# Patient Record
Sex: Male | Born: 1960 | Race: White | Hispanic: No | Marital: Married | Smoking: Never smoker
Health system: Southern US, Community
[De-identification: ages and names within clinical notes are randomized; demographics above are authoritative.]

## PROBLEM LIST (undated history)

## (undated) DIAGNOSIS — E119 Type 2 diabetes mellitus without complications: Secondary | ICD-10-CM

## (undated) DIAGNOSIS — I2510 Atherosclerotic heart disease of native coronary artery without angina pectoris: Secondary | ICD-10-CM

## (undated) DIAGNOSIS — I10 Essential (primary) hypertension: Secondary | ICD-10-CM

---

## 2019-05-29 HISTORY — PX: LEFT HEART CATH: CATH118248

## 2020-02-04 ENCOUNTER — Encounter: Payer: Self-pay | Admitting: Emergency Medicine

## 2020-02-04 ENCOUNTER — Inpatient Hospital Stay
Admission: EM | Admit: 2020-02-04 | Discharge: 2020-02-07 | DRG: 372 | Disposition: A | Discharge status: Home / Self Care | Payer: BC Managed Care – PPO | Attending: Internal Medicine | Admitting: Internal Medicine

## 2020-02-04 ENCOUNTER — Other Ambulatory Visit: Payer: Self-pay

## 2020-02-04 ENCOUNTER — Emergency Department: Payer: BC Managed Care – PPO

## 2020-02-04 ENCOUNTER — Ambulatory Visit
Admission: EM | Admit: 2020-02-04 | Discharge: 2020-02-04 | Disposition: A | Discharge status: Home / Self Care | Payer: BC Managed Care – PPO

## 2020-02-04 DIAGNOSIS — F419 Anxiety disorder, unspecified: Secondary | ICD-10-CM | POA: Diagnosis present

## 2020-02-04 DIAGNOSIS — E785 Hyperlipidemia, unspecified: Secondary | ICD-10-CM | POA: Diagnosis present

## 2020-02-04 DIAGNOSIS — R112 Nausea with vomiting, unspecified: Secondary | ICD-10-CM

## 2020-02-04 DIAGNOSIS — Z7982 Long term (current) use of aspirin: Secondary | ICD-10-CM

## 2020-02-04 DIAGNOSIS — Z955 Presence of coronary angioplasty implant and graft: Secondary | ICD-10-CM

## 2020-02-04 DIAGNOSIS — I2510 Atherosclerotic heart disease of native coronary artery without angina pectoris: Secondary | ICD-10-CM | POA: Diagnosis present

## 2020-02-04 DIAGNOSIS — Z8249 Family history of ischemic heart disease and other diseases of the circulatory system: Secondary | ICD-10-CM

## 2020-02-04 DIAGNOSIS — Z20822 Contact with and (suspected) exposure to covid-19: Secondary | ICD-10-CM | POA: Diagnosis present

## 2020-02-04 DIAGNOSIS — Z9049 Acquired absence of other specified parts of digestive tract: Secondary | ICD-10-CM

## 2020-02-04 DIAGNOSIS — K529 Noninfective gastroenteritis and colitis, unspecified: Secondary | ICD-10-CM | POA: Diagnosis present

## 2020-02-04 DIAGNOSIS — I252 Old myocardial infarction: Secondary | ICD-10-CM

## 2020-02-04 DIAGNOSIS — K047 Periapical abscess without sinus: Secondary | ICD-10-CM | POA: Diagnosis present

## 2020-02-04 DIAGNOSIS — E119 Type 2 diabetes mellitus without complications: Secondary | ICD-10-CM

## 2020-02-04 DIAGNOSIS — A020 Salmonella enteritis: Secondary | ICD-10-CM | POA: Diagnosis not present

## 2020-02-04 DIAGNOSIS — F17210 Nicotine dependence, cigarettes, uncomplicated: Secondary | ICD-10-CM | POA: Diagnosis present

## 2020-02-04 DIAGNOSIS — Z888 Allergy status to other drugs, medicaments and biological substances status: Secondary | ICD-10-CM

## 2020-02-04 DIAGNOSIS — E1159 Type 2 diabetes mellitus with other circulatory complications: Secondary | ICD-10-CM | POA: Diagnosis present

## 2020-02-04 DIAGNOSIS — E1169 Type 2 diabetes mellitus with other specified complication: Secondary | ICD-10-CM | POA: Diagnosis present

## 2020-02-04 DIAGNOSIS — Z833 Family history of diabetes mellitus: Secondary | ICD-10-CM

## 2020-02-04 DIAGNOSIS — I152 Hypertension secondary to endocrine disorders: Secondary | ICD-10-CM | POA: Diagnosis present

## 2020-02-04 DIAGNOSIS — K921 Melena: Secondary | ICD-10-CM | POA: Diagnosis present

## 2020-02-04 DIAGNOSIS — D500 Iron deficiency anemia secondary to blood loss (chronic): Secondary | ICD-10-CM | POA: Diagnosis present

## 2020-02-04 DIAGNOSIS — Z79899 Other long term (current) drug therapy: Secondary | ICD-10-CM

## 2020-02-04 DIAGNOSIS — Z880 Allergy status to penicillin: Secondary | ICD-10-CM

## 2020-02-04 HISTORY — DX: Atherosclerotic heart disease of native coronary artery without angina pectoris: I25.10

## 2020-02-04 HISTORY — DX: Type 2 diabetes mellitus without complications: E11.9

## 2020-02-04 HISTORY — DX: Essential (primary) hypertension: I10

## 2020-02-04 LAB — GLUCOSE, CAPILLARY
Glucose-Capillary: 150 mg/dL — ABNORMAL HIGH (ref 70–99)
Glucose-Capillary: 163 mg/dL — ABNORMAL HIGH (ref 70–99)

## 2020-02-04 LAB — COMPREHENSIVE METABOLIC PANEL
ALT: 19 U/L (ref 0–44)
AST: 17 U/L (ref 15–41)
Albumin: 4.2 g/dL (ref 3.5–5.0)
Alkaline Phosphatase: 79 U/L (ref 38–126)
Anion gap: 13 (ref 5–15)
BUN: 26 mg/dL — ABNORMAL HIGH (ref 6–20)
CO2: 17 mmol/L — ABNORMAL LOW (ref 22–32)
Calcium: 9 mg/dL (ref 8.9–10.3)
Chloride: 102 mmol/L (ref 98–111)
Creatinine, Ser: 1.15 mg/dL (ref 0.61–1.24)
GFR calc Af Amer: >60 mL/min (ref >60)
GFR calc non Af Amer: >60 mL/min (ref >60)
Glucose, Bld: 158 mg/dL — ABNORMAL HIGH (ref 70–99)
Potassium: 3.5 mmol/L (ref 3.5–5.1)
Sodium: 132 mmol/L — ABNORMAL LOW (ref 135–145)
Total Bilirubin: 0.9 mg/dL (ref 0.3–1.2)
Total Protein: 8.2 g/dL — ABNORMAL HIGH (ref 6.5–8.1)

## 2020-02-04 LAB — GASTROINTESTINAL PANEL BY PCR, STOOL (REPLACES STOOL CULTURE)

## 2020-02-04 LAB — OCCULT BLOOD X 1 CARD TO LAB, STOOL: Fecal Occult Bld: POSITIVE — AB

## 2020-02-04 LAB — CBC
HCT: 45.4 % (ref 39.0–52.0)
Hemoglobin: 15.7 g/dL (ref 13.0–17.0)
MCH: 29.8 pg (ref 26.0–34.0)
MCHC: 34.6 g/dL (ref 30.0–36.0)
MCV: 86.3 fL (ref 80.0–100.0)
Platelets: 257 10*3/uL (ref 150–400)
RBC: 5.26 MIL/uL (ref 4.22–5.81)
RDW: 13.2 % (ref 11.5–15.5)
WBC: 15.8 10*3/uL — ABNORMAL HIGH (ref 4.0–10.5)
nRBC: 0 % (ref 0.0–0.2)

## 2020-02-04 LAB — SARS CORONAVIRUS 2 BY RT PCR (HOSPITAL ORDER, PERFORMED IN ~~LOC~~ HOSPITAL LAB): SARS Coronavirus 2: NEGATIVE

## 2020-02-04 LAB — LACTIC ACID, PLASMA
Lactic Acid, Venous: 2.4 mmol/L (ref 0.5–1.9)
Lactic Acid, Venous: 1.2 mmol/L (ref 0.5–1.9)

## 2020-02-04 LAB — LIPASE, BLOOD: Lipase: 23 U/L (ref 11–51)

## 2020-02-04 LAB — C DIFFICILE QUICK SCREEN W PCR REFLEX
C Diff antigen: NEGATIVE
C Diff interpretation: NOT DETECTED
C Diff toxin: NEGATIVE

## 2020-02-04 MED ORDER — ONDANSETRON HCL 4 MG/2ML IJ SOLN
4.0000 mg | Freq: Four times a day (QID) | INTRAMUSCULAR | Status: DC | PRN
  Administered 2020-02-05: 4 mg via INTRAVENOUS
  Filled 2020-02-04: qty 2

## 2020-02-04 MED ORDER — ACETAMINOPHEN 325 MG PO TABS: 650.0000 mg | ORAL_TABLET | Freq: Four times a day (QID) | ORAL | Status: DC | PRN

## 2020-02-04 MED ORDER — MORPHINE SULFATE (PF) 2 MG/ML IV SOLN
1.0000 mg | INTRAVENOUS | Status: AC | PRN
  Administered 2020-02-04 – 2020-02-05 (×3): 1 mg via INTRAVENOUS
  Filled 2020-02-04 (×3): qty 1

## 2020-02-04 MED ORDER — ONDANSETRON HCL 4 MG/2ML IJ SOLN
4.0000 mg | Freq: Once | INTRAMUSCULAR | Status: AC
  Administered 2020-02-04: 4 mg via INTRAVENOUS
  Filled 2020-02-04: qty 2

## 2020-02-04 MED ORDER — CIPROFLOXACIN IN D5W 400 MG/200ML IV SOLN
400.0000 mg | Freq: Two times a day (BID) | INTRAVENOUS | Status: DC
  Administered 2020-02-05 – 2020-02-07 (×5): 400 mg via INTRAVENOUS
  Filled 2020-02-04 (×6): qty 200

## 2020-02-04 MED ORDER — MORPHINE SULFATE (PF) 4 MG/ML IV SOLN
4.0000 mg | Freq: Once | INTRAVENOUS | Status: AC
  Administered 2020-02-04: 4 mg via INTRAVENOUS
  Filled 2020-02-04: qty 1

## 2020-02-04 MED ORDER — ACETAMINOPHEN 650 MG RE SUPP: 650.0000 mg | Freq: Four times a day (QID) | RECTAL | Status: DC | PRN

## 2020-02-04 MED ORDER — METRONIDAZOLE IN NACL 5-0.79 MG/ML-% IV SOLN
500.0000 mg | Freq: Three times a day (TID) | INTRAVENOUS | Status: DC
  Administered 2020-02-04 – 2020-02-05 (×2): 500 mg via INTRAVENOUS
  Filled 2020-02-04 (×4): qty 100

## 2020-02-04 MED ORDER — POTASSIUM CHLORIDE 2 MEQ/ML IV SOLN
INTRAVENOUS | Status: AC
  Filled 2020-02-04 (×3): qty 1000

## 2020-02-04 MED ORDER — INSULIN ASPART 100 UNIT/ML ~~LOC~~ SOLN
0.0000 [IU] | SUBCUTANEOUS | Status: DC
  Administered 2020-02-04: 1 [IU] via SUBCUTANEOUS
  Administered 2020-02-04 – 2020-02-05 (×3): 2 [IU] via SUBCUTANEOUS
  Administered 2020-02-05 – 2020-02-06 (×4): 1 [IU] via SUBCUTANEOUS
  Administered 2020-02-06: 2 [IU] via SUBCUTANEOUS
  Administered 2020-02-07 (×2): 1 [IU] via SUBCUTANEOUS
  Administered 2020-02-07: 2 [IU] via SUBCUTANEOUS
  Filled 2020-02-04 (×12): qty 1

## 2020-02-04 MED ORDER — CIPROFLOXACIN IN D5W 400 MG/200ML IV SOLN
400.0000 mg | Freq: Once | INTRAVENOUS | Status: DC
  Administered 2020-02-04: 400 mg via INTRAVENOUS
  Filled 2020-02-04: qty 200

## 2020-02-04 MED ORDER — METRONIDAZOLE IN NACL 5-0.79 MG/ML-% IV SOLN: 500.0000 mg | Freq: Once | INTRAVENOUS | Status: DC

## 2020-02-04 MED ORDER — ONDANSETRON HCL 4 MG PO TABS: 4.0000 mg | ORAL_TABLET | Freq: Four times a day (QID) | ORAL | Status: DC | PRN

## 2020-02-04 MED ORDER — SODIUM CHLORIDE 0.9 % IV BOLUS
1000.0000 mL | Freq: Once | INTRAVENOUS | Status: AC
  Administered 2020-02-04: 1000 mL via INTRAVENOUS

## 2020-02-04 MED ORDER — IOHEXOL 300 MG/ML  SOLN
100.0000 mL | Freq: Once | INTRAMUSCULAR | Status: AC | PRN
  Administered 2020-02-04: 100 mL via INTRAVENOUS

## 2020-02-04 NOTE — H&P (Addendum)
History and Physical    Alexander Benson ASN:[id number] DOB: 07/24/1960 DOA: 02/04/2020  PCP: System, Provider Not In  Patient coming from: Urgent care  I have personally briefly reviewed patient's old medical records in [facility]  Chief Complaint: Frequent diarrhea  HPI: Alexander Benson is a 59 y.o. male with medical history significant for CAD s/p NSTEMI/PCI with DES of LAD 05/29/2019, T2DM, HTN, HLD, anxiety, and tobacco use who presents to the ED for evaluation of abdominal pain and diarrhea.  Patient states about 1 week ago he had a very large meal consisting of chicken and dumplings and became extremely bloated with abdominal pain afterwards. He says since then he has not been able to eat much. He says he also had a tooth infection a week ago and was started on an antibiotic and 800 mg ibuprofen by his dentist. He says he took 2 or 3 doses of these medications each.  2 days ago he developed sudden onset of frequent watery diarrhea. He says stools initially brown-colored, then clear, and now dark black in appearance. He has not been able to maintain any adequate oral intake. He has had nausea and dry heaves without emesis. He denies subjective fevers but reports chills and significant diaphoresis. He has had pain across his lower abdomen. He has not seen any bright red blood from his rectum or any other obvious bleeding.  He denies any chest pain, dyspnea, or dysuria. He is a current smoker and is attempting to quit. He says he did not tolerate Wellbutrin which was recently started for smoking cessation assistance.  He has been on aspirin and Brilinta but has not taken since his symptoms began.  ED Course:  Initial vitals showed BP 127/92, pulse 96, RR 18, temp 98.1 Fahrenheit, SPO2 96% on room air.  Labs show WBC 15.8, hemoglobin 15.7, platelets 257,000, sodium 132, potassium 3.5, bicarb 17, BUN 26, creatinine 1.15, serum glucose 158, LFTs within normal limits, lipase 23, lactic acid  2.4.  GI pathogen panel and C. difficile tests were ordered and pending.  Per EDP FOBT is strongly positive.  SARS-CoV-2 PCR is ordered and pending.  CT abdomen/pelvis with contrast shows liquid stool throughout the colon with wall thickening of the cecum, ascending colon, and hepatic flexure suggestive of infectious or inflammatory colitis.  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  EDP discussed with on-call GI who will consult on patient in the morning. Patient was given 1 L normal saline, IV morphine 4 mg, IV Zofran, in order to receive IV Cipro and Flagyl.  The hospitalist service was consulted to admit for further evaluation and management.  Review of Systems: All systems reviewed and are negative except as documented in history of present illness above.   Past Medical History:  Diagnosis Date  . Coronary artery disease   . Diabetes mellitus without complication (HCC)   . Hypertension     Past Surgical History:  Procedure Laterality Date  . LEFT HEART CATH  05/29/2019   S/p DES to LAD    Social History:  reports that he has never smoked. He has never used smokeless tobacco. He reports that he does not drink alcohol and does not use drugs.  Allergies  Allergen Reactions  . Metformin Rash  . Penicillins Rash and Dermatitis    No known reaction No known reaction     Family History  Problem Relation Age of Onset  . Hypertension  Mother   . Diabetes Brother      Prior to Admission medications   Medication Sig Start Date End Date Taking? Authorizing Provider  aspirin 81 MG EC tablet Take 81 mg by mouth daily.  09/13/19 09/07/20 Yes [provider]  atorvastatin (LIPITOR) 80 MG tablet Take 80 mg by mouth at bedtime.  09/28/19  Yes [provider]  carvedilol (COREG) 6.25 MG tablet Take 6.25 mg by mouth in the morning and at bedtime.  06/24/19  Yes [provider]   ticagrelor (BRILINTA) 90 MG TABS tablet Take 90 mg by mouth in the morning and at bedtime.  06/24/19 06/23/20 Yes [provider]    Physical Exam: Vitals:   02/04/20 1715 02/04/20 1730 02/04/20 1745 02/04/20 1800  BP:  125/82  126/82  Pulse: 83 85 80 87  Resp:      Temp:      TempSrc:      SpO2: 98% 97% 96% 95%  Weight:      Height:       Constitutional: Resting supine in bed, NAD, calm, comfortable Eyes: PERRL, lids and conjunctivae normal ENMT: Mucous membranes are dry. Posterior pharynx clear of any exudate or lesions.poor dentition.  Neck: normal, supple, no masses. Respiratory: clear to auscultation bilaterally, no wheezing, no crackles. Normal respiratory effort. No accessory muscle use.  Cardiovascular: Regular rate and rhythm, no murmurs / rubs / gallops. No extremity edema. 2+ pedal pulses. Abdomen: Mild epigastric tenderness with radiation to lower abdomen, no masses palpated. No hepatosplenomegaly. Bowel sounds positive.  Musculoskeletal: no clubbing / cyanosis. No joint deformity upper and lower extremities. Good ROM, no contractures. Normal muscle tone.  Skin: no rashes, lesions, ulcers. No induration Neurologic: CN 2-12 grossly intact. Sensation intact, Strength 5/5 in all 4.  Psychiatric: Normal judgment and insight. Alert and oriented x 3. Normal mood.   Labs on Admission: I have personally reviewed following labs and imaging studies  CBC: Recent Labs  Lab 02/04/20 1015  WBC 15.8*  HGB 15.7  HCT 45.4  MCV 86.3  PLT 257   Basic Metabolic Panel: Recent Labs  Lab 02/04/20 1015  NA 132*  K 3.5  CL 102  CO2 17*  GLUCOSE 158*  BUN 26*  CREATININE 1.15  CALCIUM 9.0   GFR: Estimated Creatinine Clearance: 74.6 mL/min (by C-G formula based on SCr of 1.15 mg/dL). Liver Function Tests: Recent Labs  Lab 02/04/20 1015  AST 17  ALT 19  ALKPHOS 79  BILITOT 0.9  PROT 8.2*  ALBUMIN 4.2   Recent Labs  Lab 02/04/20 1626  LIPASE 23   No results  for input(s): AMMONIA in the last 168 hours. Coagulation Profile: No results for input(s): INR, PROTIME in the last 168 hours. Cardiac Enzymes: No results for input(s): CKTOTAL, CKMB, CKMBINDEX, TROPONINI in the last 168 hours. BNP (last 3 results) No results for input(s): PROBNP in the last 8760 hours. HbA1C: No results for input(s): HGBA1C in the last 72 hours. CBG: No results for input(s): GLUCAP in the last 168 hours. Lipid Profile: No results for input(s): CHOL, HDL, LDLCALC, TRIG, CHOLHDL, LDLDIRECT in the last 72 hours. Thyroid Function Tests: No results for input(s): TSH, T4TOTAL, FREET4, T3FREE, THYROIDAB in the last 72 hours. Anemia Panel: No results for input(s): VITAMINB12, FOLATE, FERRITIN, TIBC, IRON, RETICCTPCT in the last 72 hours. Urine analysis: No results found for: COLORURINE, APPEARANCEUR, LABSPEC, PHURINE, GLUCOSEU, HGBUR, BILIRUBINUR, KETONESUR, PROTEINUR, UROBILINOGEN, NITRITE, LEUKOCYTESUR  Radiological Exams on Admission: CT ABDOMEN PELVIS  W CONTRAST  Result Date: 02/04/2020 CLINICAL DATA:  Abdominal pain.  Swelling and weakness. EXAM: CT ABDOMEN AND PELVIS WITH CONTRAST TECHNIQUE: Multidetector CT imaging of the abdomen and pelvis was performed using the standard protocol following bolus administration of intravenous contrast. CONTRAST:  OMNIPAQUE IOHEXOL 300 MG/ML  SOLN COMPARISON:  None. FINDINGS: Lower chest: The lung bases are clear. The heart size is normal. Hepatobiliary: The liver is normal. Status post cholecystectomy.There is no biliary ductal dilation. Pancreas: Normal contours without ductal dilatation. No peripancreatic fluid collection. Spleen: Unremarkable. Adrenals/Urinary Tract: --Adrenal glands: Unremarkable. --Right kidney/ureter: No hydronephrosis or radiopaque kidney stones. --Left kidney/ureter: No hydronephrosis or radiopaque kidney stones. --Urinary bladder: Unremarkable. Stomach/Bowel: --Stomach/Duodenum: No hiatal hernia or other  gastric abnormality. Normal duodenal course and caliber. --Small bowel: Unremarkable. --Colon: Liquid stool is noted throughout the colon. There is some mild circumferential wall thickening of the cecum, ascending colon, and hepatic flexure. --Appendix: Normal. Vascular/Lymphatic: Atherosclerotic calcification is present within the non-aneurysmal abdominal aorta, without hemodynamically significant stenosis. 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 --No retroperitoneal lymphadenopathy. --No mesenteric lymphadenopathy. --No pelvic or inguinal lymphadenopathy. Reproductive: Unremarkable Other: No ascites or free air. The abdominal wall is normal. Musculoskeletal. There is no acute displaced fracture. 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 IMPRESSION: 1. Liquid stool throughout the colon with mild circumferential wall thickening of the cecum, ascending colon, and hepatic flexure, suggestive of infectious or inflammatory colitis. 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 Aortic Atherosclerosis (ICD10-I70.0). Electronically Signed   By: Katherine Mantle M.D.   On: 02/04/2020 16:59    EKG: Not performed.  Assessment/Plan Principal Problem:   Colitis Active Problems:   Coronary artery disease   Diabetes mellitus without complication (HCC)   Hypertension associated with diabetes (HCC)   Hyperlipidemia associated with type 2 diabetes mellitus (HCC)  Alexander Benson is a 59 y.o. male with medical history significant for CAD s/p NSTEMI/PCI with DES of LAD 05/29/2019, T2DM, HTN, HLD, anxiety, and tobacco use who is admitted with colitis.  Colitis: Patient with frequent watery diarrhea which is now dark black in appearance. FOBT is positive per EDP. CT A/P shows changes suggestive of colitis. Suspect infectious etiology. Patient does state he was recently started on antibiotics for a dental infection.  He was given a dose of IV ciprofloxacin with plan to also receive IV Flagyl while in the ED. -Hold antibiotics for now pending C. difficile testing -Follow GI pathogen panel -Continue IV fluid hydration overnight -Keep n.p.o. tonight -GI consulted and will see in a.m. Addendum: C. difficile PCR quick screen is negative. Will resume IV Cipro/Flagyl.  CAD s/p PCI: History of NSTEMI with DES placed to LAD 05/29/2019. He denies any recent chest pain. -Holding home aspirin and Brilinta for now with possible GI bleeding, resume as soon as able -Resume home Coreg, atorvastatin when tolerating oral meds  Type 2 diabetes: A1c 7.3% on 01/10/2020. He is not currently on medical management. Start sensitive SSI q4h while NPO and in hospital.  Hypertension: Normotensive. Coreg on hold while NPO.  Hyperlipidemia: Atorvastatin on hold while NPO.  Tobacco use: Patient motivated and attempted to quit. He declines nicotine patch while in hospital.  DVT prophylaxis: SCDs Code Status: Full code, confirmed with patient Family Communication: Discussed with patient, he has discussed with family Disposition Plan: From home and likely discharge home pending symptomatic improvement and further work-up of colitis Consults called: GI  Admission status:  Status is: Observation  The patient remains OBS appropriate and will d/c before 2 midnights.  Dispo: The patient is from: Home              Anticipated d/c is to: Home              Anticipated d/c date is: 1 day              Patient currently is not medically stable to d/c.    Darreld Mclean MD [facility]  If 7PM-7AM, please contact night-coverage [email or web address]  02/04/2020, 7:24 PM

## 2020-02-04 NOTE — ED Triage Notes (Signed)
Pt comes into the ED via EMS from [facility] urgent care with c/o lower abd pain with diarrhea for the past 3 days, states his stools having turned to black color. Denies hx of the same.Marland Kitchen

## 2020-02-04 NOTE — ED Notes (Signed)
Patient is being discharged from the Urgent Care and sent to the Emergency Department via EMS . Per Haydee Monica, [place], patient is in need of higher level of care due to black tarry stools, weakness. Patient is also on a blood thinner. Patient is aware and verbalizes understanding of plan of care.  Vitals:   02/04/20 0843  BP: 121/80  Pulse: 84  Resp: 20  Temp: 98 F (36.7 C)  SpO2: 100%

## 2020-02-04 NOTE — ED Triage Notes (Signed)
Presents vis EMS from [facility] Urgent Care  Per EMS he has had some abd pain  With diarrhea  Noticed some black tarry stools 1-2 days ago

## 2020-02-04 NOTE — ED Triage Notes (Signed)
Patient c/o black stools that started 48 hours ago. He states he is having abdominal pain, sweating and weakness.

## 2020-02-04 NOTE — ED Notes (Signed)
Report off to christine rn.  

## 2020-02-04 NOTE — ED Notes (Signed)
Pt has abd pain with diarrhea and nausea.  Reports dry heaves.  Pt states diarrhea has blood in it.  Sx for 3 days.  Pt was brought in via ems from [place] urgent care.  Iv started and meds given.  Pt alert.

## 2020-02-04 NOTE — ED Provider Notes (Signed)
[facility] Emergency Department Provider Note ____________________________________________   First MD Initiated Contact with Patient 02/04/20 1544     (approximate)  I have reviewed the triage vital signs and the nursing notes.  HISTORY  Chief Complaint GI Bleeding  HPI Jahzir Strohmeier is a 59 y.o. male history of coronary disease, diabetes hypertension  Patient is traveling for work.  Works Personnel officer.  Would last approximately 3 to 4 days he is start developing severe lower abdominal cramps with loose stools.  Initially loose stools, then very watery and now in the last day or so he is noticed the stools to be dark having had about 2 today.  He reports that he is attempted to take Pepto-Bismol yesterday but did not see improvement.  Does take aspirin and Brilinta but no anticoagulants  He is also diabetic.  Previous cholecystectomy.  Denies history of diverticulitis.  Pain comes in severe waves of cramps usually in his lower abdomen.  Does report having a stomach ulcer once in his life several years ago but that seem different.  Patient reports to me feels like he has a infection  Past Medical History:  Diagnosis Date  . Coronary artery disease   . Diabetes mellitus without complication (HCC)   . Hypertension     Patient Active Problem List   Diagnosis Date Noted  . Colitis 02/04/2020  . Coronary artery disease   . Diabetes mellitus without complication (HCC)   . Hypertension associated with diabetes (HCC)   . Hyperlipidemia associated with type 2 diabetes mellitus (HCC)     History reviewed. No pertinent surgical history.  Prior to Admission medications   Medication Sig Start Date End Date Taking? Authorizing Provider  aspirin 81 MG EC tablet Take 81 mg by mouth daily.  09/13/19 09/07/20 Yes [provider]  atorvastatin (LIPITOR) 80 MG tablet Take 80 mg by mouth at bedtime.  09/28/19  Yes [provider]  carvedilol (COREG) 6.25 MG  tablet Take 6.25 mg by mouth in the morning and at bedtime.  06/24/19  Yes [provider]  ticagrelor (BRILINTA) 90 MG TABS tablet Take 90 mg by mouth in the morning and at bedtime.  06/24/19 06/23/20 Yes [provider]    Allergies Metformin and Penicillins  No family history on file.  Social History Social History   Tobacco Use  . Smoking status:  Current smoker  . Smokeless tobacco: Never Used  Substance Use Topics  . Alcohol use: Never  . Drug use: Never  Patient is an active smoker  Review of Systems Constitutional: No fever/chills feeling a bit fatigued the last couple days Eyes: No visual changes. ENT: No sore throat. Cardiovascular: Denies chest pain. Respiratory: Denies shortness of breath. Gastrointestinal: See HPI Genitourinary: Negative for dysuria. Musculoskeletal: Negative for back pain. Skin: Negative for rash. Neurological: Negative for headaches, areas of focal weakness or numbness.    ____________________________________________   PHYSICAL EXAM:  VITAL SIGNS: ED Triage Vitals  Enc Vitals Group     BP 02/04/20 1002 (!) 127/92     Pulse Rate 02/04/20 1002 96     Resp 02/04/20 1002 18     Temp 02/04/20 1002 98.1 F (36.7 C)     Temp Source 02/04/20 1002 Oral     SpO2 02/04/20 1002 96 %     Weight 02/04/20 1003 199 lb (90.3 kg)     Height 02/04/20 1003 5\' 11"  (1.803 m)     Head Circumference --  Peak Flow --      Pain Score 02/04/20 1003 6     Pain Loc --      Pain Edu? --      Excl. in GC? --    Constitutional: Alert and oriented. Well appearing and in no acute distress.  He is however frequently dry heaving reporting that he is currently having an episode of severe abdominal cramping but reports that once it eases off he will feel better for a little bit. Eyes: Conjunctivae are normal. Head: Atraumatic. Nose: No congestion/rhinnorhea. Mouth/Throat: Mucous membranes are moist. Neck: No stridor.  Cardiovascular: Normal  rate, regular rhythm. Grossly normal heart sounds.  Good peripheral circulation. Respiratory: Normal respiratory effort.  No retractions. Lungs CTAB. Gastrointestinal: Soft and moderate tenderness throughout, but does report worsening of tenderness across the lower abdomen bilaterally.  There is some voluntary guarding.  No distention.  Bowel sounds increased.  Rectal exam discussed with patient, patient reports he just had a stool and was black Musculoskeletal: No lower extremity tenderness nor edema. Neurologic:  Normal speech and language. No gross focal neurologic deficits are appreciated.  Skin:  Skin is warm, dry and intact. No rash noted. Psychiatric: Mood and affect are normal. Speech and behavior are normal.  ____________________________________________   LABS (all labs ordered are listed, but only abnormal results are displayed)  Labs Reviewed  COMPREHENSIVE METABOLIC PANEL - Abnormal; Notable for the following components:      Result Value   Sodium 132 (*)    CO2 17 (*)    Glucose, Bld 158 (*)    BUN 26 (*)    Total Protein 8.2 (*)    All other components within normal limits  CBC - Abnormal; Notable for the following components:   WBC 15.8 (*)    All other components within normal limits  LACTIC ACID, PLASMA - Abnormal; Notable for the following components:   Lactic Acid, Venous 2.4 (*)    All other components within normal limits  GASTROINTESTINAL PANEL BY PCR, STOOL (REPLACES STOOL CULTURE)  C DIFFICILE QUICK SCREEN W PCR REFLEX  SARS CORONAVIRUS 2 BY RT PCR (HOSPITAL ORDER, PERFORMED IN [facility])  LIPASE, BLOOD  OCCULT BLOOD X 1 CARD TO LAB, STOOL  TYPE AND SCREEN   ____________________________________________  EKG  No associated chest pain.  Crampy lower abdominal pain. ____________________________________________  RADIOLOGY  CT ABDOMEN PELVIS W CONTRAST  Result Date: 02/04/2020 CLINICAL DATA:  Abdominal pain.  Swelling and weakness.  EXAM: CT ABDOMEN AND PELVIS WITH CONTRAST TECHNIQUE: Multidetector CT imaging of the abdomen and pelvis was performed using the standard protocol following bolus administration of intravenous contrast. CONTRAST:  OMNIPAQUE IOHEXOL 300 MG/ML  SOLN COMPARISON:  None. FINDINGS: Lower chest: The lung bases are clear. The heart size is normal. Hepatobiliary: The liver is normal. Status post cholecystectomy.There is no biliary ductal dilation. Pancreas: Normal contours without ductal dilatation. No peripancreatic fluid collection. Spleen: Unremarkable. Adrenals/Urinary Tract: --Adrenal glands: Unremarkable. --Right kidney/ureter: No hydronephrosis or radiopaque kidney stones. --Left kidney/ureter: No hydronephrosis or radiopaque kidney stones. --Urinary bladder: Unremarkable. Stomach/Bowel: --Stomach/Duodenum: No hiatal hernia or other gastric abnormality. Normal duodenal course and caliber. --Small bowel: Unremarkable. --Colon: Liquid stool is noted throughout the colon. There is some mild circumferential wall thickening of the cecum, ascending colon, and hepatic flexure. --Appendix: Normal. Vascular/Lymphatic: Atherosclerotic calcification is present within the non-aneurysmal abdominal aorta, without hemodynamically significant stenosis. 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 --No retroperitoneal lymphadenopathy. --  No mesenteric lymphadenopathy. --No pelvic or inguinal lymphadenopathy. Reproductive: Unremarkable Other: No ascites or free air. The abdominal wall is normal. Musculoskeletal. There is no acute displaced fracture. 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 IMPRESSION: 1. Liquid stool throughout the colon with mild circumferential wall thickening of the cecum, ascending colon, and hepatic flexure, suggestive of infectious or inflammatory colitis. 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  Aortic Atherosclerosis (ICD10-I70.0). Electronically Signed   By: Katherine Mantle M.D.   On: 02/04/2020 16:59     Imaging reviewed, discussed with Dr. Daleen Squibb of gastroenterology.  Reviewed, concerning for colitis ____________________________________________   PROCEDURES  Procedure(s) performed: None  Procedures  Critical Care performed: No  ____________________________________________   INITIAL IMPRESSION / ASSESSMENT AND PLAN / ED COURSE  Pertinent labs & imaging results that were available during my care of the patient were reviewed by me and considered in my medical decision making (see chart for details).   Differential diagnosis includes but is not limited to, abdominal perforation, aortic dissection, cholecystitis, appendicitis, diverticulitis, colitis, esophagitis/gastritis, kidney stone, pyelonephritis, urinary tract infection, aortic aneurysm. All are considered in decision and treatment plan. Based upon the patient's presentation and risk factors, will provide pain relief, hydration, antiemetic, and obtain CT imaging   Clinical Course as of Feb 03 1830  Tue Feb 04, 2020  1616 Patient cramping improving, he is able to tolerate rectal exam.  His Hemoccult very heme positive, dark stool.   [MQ]  1756 Paged GI to discuss results/clinical exam and histroy.   [MQ]  1801 Discussed with Dr. Servando Snare. Dr. Servando Snare recommends Gi consult, overnight obs with pain control and GI consult plan. Dr. Tobi Bastos will be able to see patient.    [MQ]    Clinical Course User Index [MQ] Sharyn Creamer, MD   ----------------------------------------- 6:30 PM on 02/04/2020 -----------------------------------------  Discussed admission with Dr. Allena Katz.  He recommends we hold antibiotic pending stool culture, they will follow up on the hospitalist service.  Anticipate admission and GI consult.  Patient agreeable.  Reports his pain is significantly improved and he is feeling quite a bit better after initial  fluids and pain medications.  He is awake alert oriented, normal vital signs appears appropriate for admission  ____________________________________________   FINAL CLINICAL IMPRESSION(S) / ED DIAGNOSES  Final diagnoses:  Colitis        Note:  This document was prepared using Dragon voice recognition software and may include unintentional dictation errors       Sharyn Creamer, MD 02/04/20 1831

## 2020-02-05 ENCOUNTER — Inpatient Hospital Stay: Payer: BC Managed Care – PPO

## 2020-02-05 DIAGNOSIS — Z8249 Family history of ischemic heart disease and other diseases of the circulatory system: Secondary | ICD-10-CM | POA: Diagnosis not present

## 2020-02-05 DIAGNOSIS — F17210 Nicotine dependence, cigarettes, uncomplicated: Secondary | ICD-10-CM | POA: Diagnosis present

## 2020-02-05 DIAGNOSIS — A020 Salmonella enteritis: Secondary | ICD-10-CM | POA: Diagnosis present

## 2020-02-05 DIAGNOSIS — Z79899 Other long term (current) drug therapy: Secondary | ICD-10-CM | POA: Diagnosis not present

## 2020-02-05 DIAGNOSIS — K529 Noninfective gastroenteritis and colitis, unspecified: Secondary | ICD-10-CM | POA: Diagnosis present

## 2020-02-05 DIAGNOSIS — Z955 Presence of coronary angioplasty implant and graft: Secondary | ICD-10-CM | POA: Diagnosis not present

## 2020-02-05 DIAGNOSIS — Z20822 Contact with and (suspected) exposure to covid-19: Secondary | ICD-10-CM | POA: Diagnosis present

## 2020-02-05 DIAGNOSIS — I2510 Atherosclerotic heart disease of native coronary artery without angina pectoris: Secondary | ICD-10-CM | POA: Diagnosis present

## 2020-02-05 DIAGNOSIS — K921 Melena: Secondary | ICD-10-CM | POA: Diagnosis present

## 2020-02-05 DIAGNOSIS — E785 Hyperlipidemia, unspecified: Secondary | ICD-10-CM | POA: Diagnosis present

## 2020-02-05 DIAGNOSIS — Z833 Family history of diabetes mellitus: Secondary | ICD-10-CM | POA: Diagnosis not present

## 2020-02-05 DIAGNOSIS — D500 Iron deficiency anemia secondary to blood loss (chronic): Secondary | ICD-10-CM | POA: Diagnosis present

## 2020-02-05 DIAGNOSIS — E1169 Type 2 diabetes mellitus with other specified complication: Secondary | ICD-10-CM | POA: Diagnosis present

## 2020-02-05 DIAGNOSIS — Z7982 Long term (current) use of aspirin: Secondary | ICD-10-CM | POA: Diagnosis not present

## 2020-02-05 DIAGNOSIS — I252 Old myocardial infarction: Secondary | ICD-10-CM | POA: Diagnosis not present

## 2020-02-05 DIAGNOSIS — F419 Anxiety disorder, unspecified: Secondary | ICD-10-CM | POA: Diagnosis present

## 2020-02-05 DIAGNOSIS — K047 Periapical abscess without sinus: Secondary | ICD-10-CM | POA: Diagnosis present

## 2020-02-05 DIAGNOSIS — Z9049 Acquired absence of other specified parts of digestive tract: Secondary | ICD-10-CM | POA: Diagnosis not present

## 2020-02-05 DIAGNOSIS — Z888 Allergy status to other drugs, medicaments and biological substances status: Secondary | ICD-10-CM | POA: Diagnosis not present

## 2020-02-05 DIAGNOSIS — Z880 Allergy status to penicillin: Secondary | ICD-10-CM | POA: Diagnosis not present

## 2020-02-05 DIAGNOSIS — I152 Hypertension secondary to endocrine disorders: Secondary | ICD-10-CM | POA: Diagnosis present

## 2020-02-05 LAB — BASIC METABOLIC PANEL
Anion gap: 7 (ref 5–15)
BUN: 21 mg/dL — ABNORMAL HIGH (ref 6–20)
CO2: 24 mmol/L (ref 22–32)
Calcium: 8.1 mg/dL — ABNORMAL LOW (ref 8.9–10.3)
Chloride: 105 mmol/L (ref 98–111)
Creatinine, Ser: 1.04 mg/dL (ref 0.61–1.24)
GFR calc Af Amer: >60 mL/min (ref >60)
GFR calc non Af Amer: >60 mL/min (ref >60)
Glucose, Bld: 136 mg/dL — ABNORMAL HIGH (ref 70–99)
Potassium: 3.6 mmol/L (ref 3.5–5.1)
Sodium: 136 mmol/L (ref 135–145)

## 2020-02-05 LAB — HEMOGLOBIN AND HEMATOCRIT, BLOOD
HCT: 28.4 % — ABNORMAL LOW (ref 39.0–52.0)
Hemoglobin: 9.6 g/dL — ABNORMAL LOW (ref 13.0–17.0)

## 2020-02-05 LAB — GLUCOSE, CAPILLARY
Glucose-Capillary: 118 mg/dL — ABNORMAL HIGH (ref 70–99)
Glucose-Capillary: 118 mg/dL — ABNORMAL HIGH (ref 70–99)
Glucose-Capillary: 123 mg/dL — ABNORMAL HIGH (ref 70–99)
Glucose-Capillary: 146 mg/dL — ABNORMAL HIGH (ref 70–99)
Glucose-Capillary: 161 mg/dL — ABNORMAL HIGH (ref 70–99)
Glucose-Capillary: 166 mg/dL — ABNORMAL HIGH (ref 70–99)

## 2020-02-05 LAB — PREPARE RBC (CROSSMATCH)

## 2020-02-05 LAB — CBC
HCT: 37.2 % — ABNORMAL LOW (ref 39.0–52.0)
Hemoglobin: 12.7 g/dL — ABNORMAL LOW (ref 13.0–17.0)
MCH: 29.9 pg (ref 26.0–34.0)
MCHC: 34.1 g/dL (ref 30.0–36.0)
MCV: 87.5 fL (ref 80.0–100.0)
Platelets: 230 10*3/uL (ref 150–400)
RBC: 4.25 MIL/uL (ref 4.22–5.81)
RDW: 13.2 % (ref 11.5–15.5)
WBC: 9.8 10*3/uL (ref 4.0–10.5)
nRBC: 0 % (ref 0.0–0.2)

## 2020-02-05 LAB — HIV ANTIBODY (ROUTINE TESTING W REFLEX): HIV Screen 4th Generation wRfx: NONREACTIVE

## 2020-02-05 LAB — MAGNESIUM: Magnesium: 2 mg/dL (ref 1.7–2.4)

## 2020-02-05 LAB — ABO/RH: ABO/RH(D): B NEG

## 2020-02-05 MED ORDER — POTASSIUM CHLORIDE 2 MEQ/ML IV SOLN
INTRAVENOUS | Status: AC
  Filled 2020-02-05 (×2): qty 1000

## 2020-02-05 MED ORDER — PROMETHAZINE HCL 25 MG/ML IJ SOLN
12.5000 mg | INTRAMUSCULAR | Status: DC | PRN
  Administered 2020-02-05: 12.5 mg via INTRAVENOUS
  Filled 2020-02-05 (×2): qty 1

## 2020-02-05 MED ORDER — PANTOPRAZOLE SODIUM 40 MG IV SOLR
40.0000 mg | INTRAVENOUS | Status: DC
  Administered 2020-02-05: 40 mg via INTRAVENOUS
  Filled 2020-02-05: qty 40

## 2020-02-05 MED ORDER — SODIUM CHLORIDE 0.9% IV SOLUTION: Freq: Once | INTRAVENOUS | Status: AC

## 2020-02-05 MED ORDER — TICAGRELOR 90 MG PO TABS
90.0000 mg | ORAL_TABLET | Freq: Two times a day (BID) | ORAL | Status: DC
  Administered 2020-02-05: 90 mg via ORAL
  Filled 2020-02-05 (×3): qty 1

## 2020-02-05 MED ORDER — LORAZEPAM 2 MG/ML IJ SOLN: 1.0000 mg | Freq: Four times a day (QID) | INTRAMUSCULAR | Status: DC | PRN

## 2020-02-05 MED ORDER — ASPIRIN EC 81 MG PO TBEC
81.0000 mg | DELAYED_RELEASE_TABLET | Freq: Every day | ORAL | Status: DC
  Administered 2020-02-05: 81 mg via ORAL
  Filled 2020-02-05: qty 1

## 2020-02-05 MED ORDER — CARVEDILOL 6.25 MG PO TABS
6.2500 mg | ORAL_TABLET | Freq: Two times a day (BID) | ORAL | Status: DC
  Administered 2020-02-06 – 2020-02-07 (×3): 6.25 mg via ORAL
  Filled 2020-02-05 (×4): qty 1

## 2020-02-05 NOTE — Progress Notes (Addendum)
Cross Cover Note RN reports 8 black stools today.  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 Review of labs show 3 gm drop in hgb in 24 hours.  Repeating H & H now and every 6 hours. Starting daily PPI (increase to BID or infusion if indicated) HGB now 9.6 down from 15.7 yesterday morning. With evidence of ongoing bleeding, unit of PRBC's ordered

## 2020-02-05 NOTE — Progress Notes (Addendum)
Pt. had 6 black liquid stool today per patient. He was also vomiting x 3 and had abdominal pain after some dinner intake. PRN meds given

## 2020-02-05 NOTE — Progress Notes (Signed)
PROGRESS NOTE    Alexander Benson  [name]:[id number] DOB: 04-Jan-1961 DOA: 02/04/2020 PCP: System, Provider Not In  Brief Narrative:  59 year old male history of coronary artery disease status post NSTEMI with DES in January 2021, type 2 diabetes mellitus, hypertension, hyperlipidemia, GERD history of tobacco use who presents for abdominal pain and diarrhea  Patient endorses large meal consisting of chicken and dumplings 1 week prior to presentation and subsequent weakness and worsening diarrhea.  Patient is on dual antiplatelet therapy for coronary artery disease status post DES.  GI PCR panel positive for Salmonella  Pain improving over interval.  Feelings of nausea and vomiting also improving.   Assessment & Plan:   Principal Problem:   Colitis Active Problems:   Coronary artery disease   Diabetes mellitus without complication (HCC)   Hypertension associated with diabetes (HCC)   Hyperlipidemia associated with type 2 diabetes mellitus (HCC)  Salmonella enteritis Intractable nausea vomiting Abdominal pain Likely secondary to poultry consumption 1 week prior to presentation Seems to be responding to IV antibiotic therapy Nausea vomiting abdominal pain improved Plan: Advance to clear liquids Continue ciprofloxacin 40 mg IV every 12 hours As needed antiemetics Can DC Flagyl Monitor clinically.  Can likely advance diet as tolerated Anticipate 24 hours additional inpatient monitoring and treatment and tentative discharge home on 02/06/2020  Guaiac positive stool Likely secondary to infectious colitis No indication for transfusion Suspect that initial three-point drop in hemoglobin was dilutional in nature Plan: Monitor hemoglobin Transfuse as needed hemoglobin less than 8 Currently indication for transfusion  Coronary disease status post NSTEMI with DES in January 2021 Patient was on dual antiplatelet therapy with aspirin Brilinta Held for several days as he has been unable to  tolerate p.o. Patient stent was placed 8 months ago High risk for stent rethrombosis with holding DAPT Plan: Restart aspirin and Brilinta Monitor hemoglobin and bowel movements Restart Coreg 6.25 mg twice daily Hold statin for now  Type 2 diabetes mellitus No medical management on home regimen Last A1c 7.3 on 01/10/2020 Sensitive sliding scale insulin  Hypertension Continue home Coreg  Hyperlipidemia Statin held  Tobacco use Educate patient Nicotine patch offered, declined   DVT prophylaxis: SCD Code Status: Full Family Communication: None today Disposition Plan:Status is: Observation  The patient will require care spanning > 2 midnights and should be moved to inpatient because: IV treatments appropriate due to intensity of illness or inability to take PO  Dispo: The patient is from: Home              Anticipated d/c is to: Home              Anticipated d/c date is: 1 day              Patient currently is not medically stable to d/c.  Still symptomatic from Salmonella enteritis.  Slowly advancing diet today.  Anticipate 1 additional day of inpatient treatment monitoring prior to disposition planning.  Anticipate discharge home on 02/06/2020.   Consultants:   GI  Procedures:   none  Antimicrobials:  Ciprofloxacin  Subjective: Seen and examined.  Continues to endorse some abdominal pain but improving.  Objective: Vitals:   02/04/20 1800 02/04/20 2045 02/04/20 2334 02/05/20 0420  BP: 126/82 133/85 135/85 122/80  Pulse: 87 87 78 78  Resp:  20 20 20   Temp:  (!) 97.5 F (36.4 C) 97.9 F (36.6 C) 98 F (36.7 C)  TempSrc:  Oral Oral Oral  SpO2: 95% 100% 100% 100%  Weight:  82.3 kg    Height:  5\' 11"  (1.803 m)      Intake/Output Summary (Last 24 hours) at 02/05/2020 1123 Last data filed at 02/05/2020 0958 Gross per 24 hour  Intake 1903.36 ml  Output --  Net 1903.36 ml   Filed Weights   02/04/20 1003 02/04/20 2045  Weight: 90.3 kg 82.3 kg     Examination:  General exam: Appears calm and comfortable  Respiratory system: Clear to auscultation. Respiratory effort normal. Cardiovascular system: S1 & S2 heard, RRR. No JVD, murmurs, rubs, gallops or clicks. No pedal edema. Gastrointestinal system: Nontender, nondistended.  Hyperactive bowel sounds  Central nervous system: Alert and oriented. No focal neurological deficits. Extremities: Symmetric 5 x 5 power. Skin: No rashes, lesions or ulcers Psychiatry: Judgement and insight appear normal. Mood & affect appropriate.     Data Reviewed: I have personally reviewed following labs and imaging studies  CBC: Recent Labs  Lab 02/04/20 1015 02/05/20 0647  WBC 15.8* 9.8  HGB 15.7 12.7*  HCT 45.4 37.2*  MCV 86.3 87.5  PLT 257 230   Basic Metabolic Panel: Recent Labs  Lab 02/04/20 1015 02/05/20 0647  NA 132* 136  K 3.5 3.6  CL 102 105  CO2 17* 24  GLUCOSE 158* 136*  BUN 26* 21*  CREATININE 1.15 1.04  CALCIUM 9.0 8.1*  MG  --  2.0   GFR: Estimated Creatinine Clearance: 82.5 mL/min (by C-G formula based on SCr of 1.04 mg/dL). Liver Function Tests: Recent Labs  Lab 02/04/20 1015  AST 17  ALT 19  ALKPHOS 79  BILITOT 0.9  PROT 8.2*  ALBUMIN 4.2   Recent Labs  Lab 02/04/20 1626  LIPASE 23   No results for input(s): AMMONIA in the last 168 hours. Coagulation Profile: No results for input(s): INR, PROTIME in the last 168 hours. Cardiac Enzymes: No results for input(s): CKTOTAL, CKMB, CKMBINDEX, TROPONINI in the last 168 hours. BNP (last 3 results) No results for input(s): PROBNP in the last 8760 hours. HbA1C: No results for input(s): HGBA1C in the last 72 hours. CBG: Recent Labs  Lab 02/04/20 2055 02/04/20 2330 02/05/20 0425 02/05/20 0759  GLUCAP 163* 150* 146* 118*   Lipid Profile: No results for input(s): CHOL, HDL, LDLCALC, TRIG, CHOLHDL, LDLDIRECT in the last 72 hours. Thyroid Function Tests: No results for input(s): TSH, T4TOTAL, FREET4,  T3FREE, THYROIDAB in the last 72 hours. Anemia Panel: No results for input(s): VITAMINB12, FOLATE, FERRITIN, TIBC, IRON, RETICCTPCT in the last 72 hours. Sepsis Labs: Recent Labs  Lab 02/04/20 1626 02/04/20 2134  LATICACIDVEN 2.4* 1.2    Recent Results (from the past 240 hour(s))  Gastrointestinal Panel by PCR , Stool     Status: Abnormal   Collection Time: 02/04/20  3:57 PM   Specimen: Stool  Result Value Ref Range Status   Campylobacter species NOT DETECTED NOT DETECTED Final   Plesimonas shigelloides NOT DETECTED NOT DETECTED Final   Salmonella species DETECTED (A) NOT DETECTED Final    Comment: RESULT CALLED TO, READ BACK BY AND VERIFIED WITH: DEMARCUS STEWARD AT 2306 ON 02/04/20 RWW    Yersinia enterocolitica NOT DETECTED NOT DETECTED Final   Vibrio species NOT DETECTED NOT DETECTED Final   Vibrio cholerae NOT DETECTED NOT DETECTED Final   Enteroaggregative E coli (EAEC) NOT DETECTED NOT DETECTED Final   Enteropathogenic E coli (EPEC) NOT DETECTED NOT DETECTED Final   Enterotoxigenic E coli (ETEC) NOT DETECTED NOT DETECTED Final   Shiga like toxin producing  E coli (STEC) NOT DETECTED NOT DETECTED Final   Shigella/Enteroinvasive E coli (EIEC) NOT DETECTED NOT DETECTED Final   Cryptosporidium NOT DETECTED NOT DETECTED Final   Cyclospora cayetanensis NOT DETECTED NOT DETECTED Final   Entamoeba histolytica NOT DETECTED NOT DETECTED Final   Giardia lamblia NOT DETECTED NOT DETECTED Final   Adenovirus F40/41 NOT DETECTED NOT DETECTED Final   Astrovirus NOT DETECTED NOT DETECTED Final   Norovirus GI/GII NOT DETECTED NOT DETECTED Final   Rotavirus A NOT DETECTED NOT DETECTED Final   Sapovirus (I, II, IV, and V) NOT DETECTED NOT DETECTED Final    Comment: Performed at [facility], [address] [place], [place] [address]  C Difficile Quick Screen w PCR reflex     Status: None   Collection Time: 02/04/20  3:57 PM   Specimen: STOOL  Result Value Ref Range Status   C  Diff antigen NEGATIVE NEGATIVE Final   C Diff toxin NEGATIVE NEGATIVE Final   C Diff interpretation No C. difficile detected.  Final    Comment: Performed at [facility], [address] [place], [place] [address]  SARS Coronavirus 2 by RT PCR (hospital order, performed in [facility] hospital lab) Nasopharyngeal Nasopharyngeal Swab     Status: None   Collection Time: 02/04/20  6:09 PM   Specimen: Nasopharyngeal Swab  Result Value Ref Range Status   SARS Coronavirus 2 NEGATIVE NEGATIVE Final    Comment: (NOTE) SARS-CoV-2 target nucleic acids are NOT DETECTED.  The SARS-CoV-2 RNA is generally detectable in upper and lower respiratory specimens during the acute phase of infection. The lowest concentration of SARS-CoV-2 viral copies this assay can detect is 250 copies / mL. A negative result does not preclude SARS-CoV-2 infection and should not be used as the sole basis for treatment or other patient management decisions.  A negative result may occur with improper specimen collection / handling, submission of specimen other than nasopharyngeal swab, presence of viral mutation(s) within the areas targeted by this assay, and inadequate number of viral copies (<250 copies / mL). A negative result must be combined with clinical observations, patient history, and epidemiological information.  Fact Sheet for Patients:   BoilerBrush.com.cy  Fact Sheet for Healthcare Providers: https://pope.com/  This test is not yet approved or  cleared by the [place] FDA and has been authorized for detection and/or diagnosis of SARS-CoV-2 by FDA under an Emergency Use Authorization (EUA).  This EUA will remain in effect (meaning this test can be used) for the duration of the COVID-19 declaration under Section 564(b)(1) of the Act, 21 U.S.C. section 360bbb-3(b)(1), unless the authorization is terminated or revoked sooner.  Performed at  [facility], [address] [place], [place] [address]          Radiology Studies: CT ABDOMEN PELVIS W CONTRAST  Result Date: 02/04/2020 CLINICAL DATA:  Abdominal pain.  Swelling and weakness. EXAM: CT ABDOMEN AND PELVIS WITH CONTRAST TECHNIQUE: Multidetector CT imaging of the abdomen and pelvis was performed using the standard protocol following bolus administration of intravenous contrast. CONTRAST:  OMNIPAQUE IOHEXOL 300 MG/ML  SOLN COMPARISON:  None. FINDINGS: Lower chest: The lung bases are clear. The heart size is normal. Hepatobiliary: The liver is normal. Status post cholecystectomy.There is no biliary ductal dilation. Pancreas: Normal contours without ductal dilatation. No peripancreatic fluid collection. Spleen: Unremarkable. Adrenals/Urinary Tract: --Adrenal glands: Unremarkable. --Right kidney/ureter: No hydronephrosis or radiopaque kidney stones. --Left kidney/ureter: No hydronephrosis or radiopaque kidney stones. --Urinary bladder:  Unremarkable. Stomach/Bowel: --Stomach/Duodenum: No hiatal hernia or other gastric abnormality. Normal duodenal course and caliber. --Small bowel: Unremarkable. --Colon: Liquid stool is noted throughout the colon. There is some mild circumferential wall thickening of the cecum, ascending colon, and hepatic flexure. --Appendix: Normal. Vascular/Lymphatic: Atherosclerotic calcification is present within the non-aneurysmal abdominal aorta, without hemodynamically significant stenosis. 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 --No retroperitoneal lymphadenopathy. --No mesenteric lymphadenopathy. --No pelvic or inguinal lymphadenopathy. Reproductive: Unremarkable Other: No ascites or free air. The abdominal wall is normal. Musculoskeletal. There is no acute displaced fracture. 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 IMPRESSION: 1. Liquid stool throughout the colon with mild circumferential wall  thickening of the cecum, ascending colon, and hepatic flexure, suggestive of infectious or inflammatory colitis. 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 Aortic Atherosclerosis (ICD10-I70.0). Electronically Signed   By: Katherine Mantle M.D.   On: 02/04/2020 16:59        Scheduled Meds: . insulin aspart  0-9 Units Subcutaneous Q4H   Continuous Infusions: . ciprofloxacin Stopped (02/05/20 0603)  . lactated ringers with kcl 125 mL/hr at 02/05/20 0823     LOS: 0 days    Time spent: 25 minutes    Tresa Moore, MD [facility] Pager [phone]  If 7PM-7AM, please contact night-coverage 02/05/2020, 11:23 AM

## 2020-02-06 ENCOUNTER — Inpatient Hospital Stay: Payer: BC Managed Care – PPO

## 2020-02-06 ENCOUNTER — Encounter: Payer: Self-pay | Admitting: Internal Medicine

## 2020-02-06 LAB — BPAM RBC
Blood Product Expiration Date: 202110222359
ISSUE DATE / TIME: 202109222319
Unit Type and Rh: 1700

## 2020-02-06 LAB — HEMOGLOBIN AND HEMATOCRIT, BLOOD
HCT: 30.1 % — ABNORMAL LOW (ref 39.0–52.0)
HCT: 30.1 % — ABNORMAL LOW (ref 39.0–52.0)
HCT: 28.8 % — ABNORMAL LOW (ref 39.0–52.0)
Hemoglobin: 10.7 g/dL — ABNORMAL LOW (ref 13.0–17.0)
Hemoglobin: 10.2 g/dL — ABNORMAL LOW (ref 13.0–17.0)
Hemoglobin: 10.2 g/dL — ABNORMAL LOW (ref 13.0–17.0)

## 2020-02-06 LAB — TYPE AND SCREEN
ABO/RH(D): B NEG
Antibody Screen: NEGATIVE
Unit division: 0

## 2020-02-06 LAB — GLUCOSE, CAPILLARY
Glucose-Capillary: 131 mg/dL — ABNORMAL HIGH (ref 70–99)
Glucose-Capillary: 121 mg/dL — ABNORMAL HIGH (ref 70–99)
Glucose-Capillary: 117 mg/dL — ABNORMAL HIGH (ref 70–99)
Glucose-Capillary: 149 mg/dL — ABNORMAL HIGH (ref 70–99)
Glucose-Capillary: 72 mg/dL (ref 70–99)

## 2020-02-06 MED ORDER — IOHEXOL 350 MG/ML SOLN
100.0000 mL | Freq: Once | INTRAVENOUS | Status: AC | PRN
  Administered 2020-02-06: 100 mL via INTRAVENOUS

## 2020-02-06 MED ORDER — POTASSIUM CHLORIDE 2 MEQ/ML IV SOLN
INTRAVENOUS | Status: DC
  Filled 2020-02-06 (×4): qty 1000

## 2020-02-06 MED ORDER — PANTOPRAZOLE SODIUM 40 MG IV SOLR
40.0000 mg | Freq: Two times a day (BID) | INTRAVENOUS | Status: DC
  Administered 2020-02-06 – 2020-02-07 (×3): 40 mg via INTRAVENOUS
  Filled 2020-02-06 (×3): qty 40

## 2020-02-06 NOTE — Progress Notes (Signed)
PROGRESS NOTE    Shanon AceMark Defoor  ZOX:[id number] DOB: 1960/11/23 DOA: 02/04/2020 PCP: System, Provider Not In  Brief Narrative:  59 year old male history of coronary artery disease status post NSTEMI with DES in January 2021, type 2 diabetes mellitus, hypertension, hyperlipidemia, GERD history of tobacco use who presents for abdominal pain and diarrhea  Patient endorses large meal consisting of chicken and dumplings 1 week prior to presentation and subsequent weakness and worsening diarrhea.  Patient is on dual antiplatelet therapy for coronary artery disease status post DES.  GI PCR panel positive for Salmonella  Patient has had waxing and waning symptoms.  He had an acute drop in his hemoglobin after several black tarry stools.  He was transfused 1 unit packed red blood cells on 02/05/2020 with appropriate rise in his hemoglobin.  Remains hemodynamically stable.   Assessment & Plan:   Principal Problem:   Colitis Active Problems:   Coronary artery disease   Diabetes mellitus without complication (HCC)   Hypertension associated with diabetes (HCC)   Hyperlipidemia associated with type 2 diabetes mellitus (HCC)   Salmonella enteritis  Salmonella enteritis Intractable nausea vomiting Abdominal pain Likely secondary to poultry consumption 1 week prior to presentation Seems to be responding to IV antibiotic therapy Nausea vomiting abdominal pain initially improved however when diet was cautiously advanced patient was unable to tolerate.  He had several episodes of melenic stools with associated nausea and vomiting. Plan: N.p.o. for now.  Okay for ice chips Continue ciprofloxacin 400 mg IV every 12 hours As needed antiemetics Submental IV fluids Monitor clinically.  Melenic stools Symptomatic blood loss anemia Secondary to infectious process Patient had significant drop in his hemoglobin from high of 15 to a low of 9 Was transfused 1 unit on 02/05/2020 for active bleeding Started on  Protonix twice daily Plan: Continue Protonix Trend hemoglobin every 6 hours Transfuse as needed hemoglobin less than 8 Hold dual antiplatelet therapy  Coronary disease status post NSTEMI with DES in January 2021 Patient was on dual antiplatelet therapy with aspirin Brilinta Held for several days as he has been unable to tolerate p.o. Patient stent was placed 8 months ago High risk for stent rethrombosis with holding DAPT Per patient he has been adherent to his DAPT since January.  He only stopped taking from Monday approximately 3 days prior to presentation Plan: Hold aspirin and Brilinta Monitor hemoglobin and bowel movements Continue Coreg 6.25 mg twice daily Hold statin for now  Type 2 diabetes mellitus No medical management on home regimen Last A1c 7.3 on 01/10/2020 Sensitive sliding scale insulin  Hypertension Continue home Coreg  Hyperlipidemia Statin held  Tobacco use Educate patient Nicotine patch offered, declined   DVT prophylaxis: SCD Code Status: Full Family Communication: wife Steward DroneBrenda via phone [phone] on 02/06/2020 Disposition Plan:Status is: Inpatient  Remains inpatient appropriate because:Inpatient level of care appropriate due to severity of illness   Dispo: The patient is from: Home              Anticipated d/c is to: Home              Anticipated d/c date is: 2 days              Patient currently is not medically stable to d/c.   Still symptomatic from Salmonella enteritis and infectious colitis.  Symptomatic anemia secondary to GI bleed.  Will anticipate additional 48 hours of inpatient treatment and monitoring prior to disposition planning.    Consultants:  none Procedures:  none  Antimicrobials:  Ciprofloxacin  Subjective: Seen and examined.  Endorsing intractable nausea and vomiting and diarrhea  Objective: Vitals:   02/05/20 2316 02/05/20 2344 02/06/20 0147 02/06/20 0803  BP: (!) 127/92 113/80 119/78 137/72  Pulse: 77 79  78 67  Resp: 16 18 16 19   Temp: 98.1 F (36.7 C) 98.2 F (36.8 C) (!) 97.5 F (36.4 C)   TempSrc: Oral Oral Oral   SpO2: 100% 99% 100% 98%  Weight:      Height:        Intake/Output Summary (Last 24 hours) at 02/06/2020 1058 Last data filed at 02/06/2020 1000 Gross per 24 hour  Intake 864.9 ml  Output 300 ml  Net 564.9 ml   Filed Weights   02/04/20 1003 02/04/20 2045  Weight: 90.3 kg 82.3 kg    Examination:  General: No apparent distress, patient appears fatigued HEENT: Normocephalic, atraumatic Neck, supple, trachea midline, no tenderness Heart: Regular rate and rhythm, S1/S2 normal, no murmurs Lungs: Clear to auscultation bilaterally, no adventitious sounds, normal work of breathing Abdomen: Soft, nondistended, hyperactive bowel sounds, tender to palpation epigastrium Extremities: Normal, atraumatic, no clubbing or cyanosis, normal muscle tone Skin: No rashes or lesions, normal color Neurologic: Cranial nerves grossly intact, sensation intact, alert and oriented x3 Psychiatric: Normal affect      Data Reviewed: I have personally reviewed following labs and imaging studies  CBC: Recent Labs  Lab 02/04/20 1015 02/05/20 0647 02/05/20 2124 02/06/20 0352 02/06/20 0912  WBC 15.8* 9.8  --   --   --   HGB 15.7 12.7* 9.6* 10.7* 10.2*  HCT 45.4 37.2* 28.4* 30.1* 30.1*  MCV 86.3 87.5  --   --   --   PLT 257 230  --   --   --    Basic Metabolic Panel: Recent Labs  Lab 02/04/20 1015 02/05/20 0647  NA 132* 136  K 3.5 3.6  CL 102 105  CO2 17* 24  GLUCOSE 158* 136*  BUN 26* 21*  CREATININE 1.15 1.04  CALCIUM 9.0 8.1*  MG  --  2.0   GFR: Estimated Creatinine Clearance: 82.5 mL/min (by C-G formula based on SCr of 1.04 mg/dL). Liver Function Tests: Recent Labs  Lab 02/04/20 1015  AST 17  ALT 19  ALKPHOS 79  BILITOT 0.9  PROT 8.2*  ALBUMIN 4.2   Recent Labs  Lab 02/04/20 1626  LIPASE 23   No results for input(s): AMMONIA in the last 168  hours. Coagulation Profile: No results for input(s): INR, PROTIME in the last 168 hours. Cardiac Enzymes: No results for input(s): CKTOTAL, CKMB, CKMBINDEX, TROPONINI in the last 168 hours. BNP (last 3 results) No results for input(s): PROBNP in the last 8760 hours. HbA1C: No results for input(s): HGBA1C in the last 72 hours. CBG: Recent Labs  Lab 02/05/20 1703 02/05/20 2029 02/05/20 2338 02/06/20 0506 02/06/20 0804  GLUCAP 118* 161* 123* 131* 121*   Lipid Profile: No results for input(s): CHOL, HDL, LDLCALC, TRIG, CHOLHDL, LDLDIRECT in the last 72 hours. Thyroid Function Tests: No results for input(s): TSH, T4TOTAL, FREET4, T3FREE, THYROIDAB in the last 72 hours. Anemia Panel: No results for input(s): VITAMINB12, FOLATE, FERRITIN, TIBC, IRON, RETICCTPCT in the last 72 hours. Sepsis Labs: Recent Labs  Lab 02/04/20 1626 02/04/20 2134  LATICACIDVEN 2.4* 1.2    Recent Results (from the past 240 hour(s))  Gastrointestinal Panel by PCR , Stool     Status: Abnormal   Collection Time: 02/04/20  3:57 PM  Specimen: Stool  Result Value Ref Range Status   Campylobacter species NOT DETECTED NOT DETECTED Final   Plesimonas shigelloides NOT DETECTED NOT DETECTED Final   Salmonella species DETECTED (A) NOT DETECTED Final    Comment: RESULT CALLED TO, READ BACK BY AND VERIFIED WITH: DEMARCUS STEWARD AT 2306 ON 02/04/20 RWW    Yersinia enterocolitica NOT DETECTED NOT DETECTED Final   Vibrio species NOT DETECTED NOT DETECTED Final   Vibrio cholerae NOT DETECTED NOT DETECTED Final   Enteroaggregative E coli (EAEC) NOT DETECTED NOT DETECTED Final   Enteropathogenic E coli (EPEC) NOT DETECTED NOT DETECTED Final   Enterotoxigenic E coli (ETEC) NOT DETECTED NOT DETECTED Final   Shiga like toxin producing E coli (STEC) NOT DETECTED NOT DETECTED Final   Shigella/Enteroinvasive E coli (EIEC) NOT DETECTED NOT DETECTED Final   Cryptosporidium NOT DETECTED NOT DETECTED Final   Cyclospora  cayetanensis NOT DETECTED NOT DETECTED Final   Entamoeba histolytica NOT DETECTED NOT DETECTED Final   Giardia lamblia NOT DETECTED NOT DETECTED Final   Adenovirus F40/41 NOT DETECTED NOT DETECTED Final   Astrovirus NOT DETECTED NOT DETECTED Final   Norovirus GI/GII NOT DETECTED NOT DETECTED Final   Rotavirus A NOT DETECTED NOT DETECTED Final   Sapovirus (I, II, IV, and V) NOT DETECTED NOT DETECTED Final    Comment: Performed at [facility], [address] [place], [place] [address]  C Difficile Quick Screen w PCR reflex     Status: None   Collection Time: 02/04/20  3:57 PM   Specimen: STOOL  Result Value Ref Range Status   C Diff antigen NEGATIVE NEGATIVE Final   C Diff toxin NEGATIVE NEGATIVE Final   C Diff interpretation No C. difficile detected.  Final    Comment: Performed at [facility], [address] [place], [place] [address]  SARS Coronavirus 2 by RT PCR (hospital order, performed in [facility] hospital lab) Nasopharyngeal Nasopharyngeal Swab     Status: None   Collection Time: 02/04/20  6:09 PM   Specimen: Nasopharyngeal Swab  Result Value Ref Range Status   SARS Coronavirus 2 NEGATIVE NEGATIVE Final    Comment: (NOTE) SARS-CoV-2 target nucleic acids are NOT DETECTED.  The SARS-CoV-2 RNA is generally detectable in upper and lower respiratory specimens during the acute phase of infection. The lowest concentration of SARS-CoV-2 viral copies this assay can detect is 250 copies / mL. A negative result does not preclude SARS-CoV-2 infection and should not be used as the sole basis for treatment or other patient management decisions.  A negative result may occur with improper specimen collection / handling, submission of specimen other than nasopharyngeal swab, presence of viral mutation(s) within the areas targeted by this assay, and inadequate number of viral copies (<250 copies / mL). A negative result must be combined with clinical observations,  patient history, and epidemiological information.  Fact Sheet for Patients:   BoilerBrush.com.cy  Fact Sheet for Healthcare Providers: https://pope.com/  This test is not yet approved or  cleared by the [place] FDA and has been authorized for detection and/or diagnosis of SARS-CoV-2 by FDA under an Emergency Use Authorization (EUA).  This EUA will remain in effect (meaning this test can be used) for the duration of the COVID-19 declaration under Section 564(b)(1) of the Act, 21 U.S.C. section 360bbb-3(b)(1), unless the authorization is terminated or revoked sooner.  Performed at [facility], [address] [place], [place] [address]  Radiology Studies: DG Abd 1 View  Result Date: 02/05/2020 CLINICAL DATA:  Intractable nausea and vomiting. EXAM: ABDOMEN - 1 VIEW COMPARISON:  Abdomen pelvis CT, dated February 04, 2020. FINDINGS: There is no evidence of bowel dilatation. Thickening of the wall of the transverse colon is suspected due to an extensive amount of thumb printing within this region. Radiopaque surgical clips are seen overlying the right upper quadrant and lower left pelvis. 4 mm and 5 mm soft tissue calcifications are seen overlying the expected region of the mid left kidney. Upon evaluation of the prior abdomen pelvis CT, these appear to be vascular in origin. IMPRESSION: 1. Findings likely consistent with colitis involving the transverse colon. 2. Subcentimeter calcifications within the mid left abdomen which appear to be vascular in location. 3. Prior cholecystectomy. Electronically Signed   By: Aram Candela M.D.   On: 02/05/2020 19:40   CT ABDOMEN PELVIS W CONTRAST  Result Date: 02/04/2020 CLINICAL DATA:  Abdominal pain.  Swelling and weakness. EXAM: CT ABDOMEN AND PELVIS WITH CONTRAST TECHNIQUE: Multidetector CT imaging of the abdomen and pelvis was performed using the standard protocol  following bolus administration of intravenous contrast. CONTRAST:  OMNIPAQUE IOHEXOL 300 MG/ML  SOLN COMPARISON:  None. FINDINGS: Lower chest: The lung bases are clear. The heart size is normal. Hepatobiliary: The liver is normal. Status post cholecystectomy.There is no biliary ductal dilation. Pancreas: Normal contours without ductal dilatation. No peripancreatic fluid collection. Spleen: Unremarkable. Adrenals/Urinary Tract: --Adrenal glands: Unremarkable. --Right kidney/ureter: No hydronephrosis or radiopaque kidney stones. --Left kidney/ureter: No hydronephrosis or radiopaque kidney stones. --Urinary bladder: Unremarkable. Stomach/Bowel: --Stomach/Duodenum: No hiatal hernia or other gastric abnormality. Normal duodenal course and caliber. --Small bowel: Unremarkable. --Colon: Liquid stool is noted throughout the colon. There is some mild circumferential wall thickening of the cecum, ascending colon, and hepatic flexure. --Appendix: Normal. Vascular/Lymphatic: Atherosclerotic calcification is present within the non-aneurysmal abdominal aorta, without hemodynamically significant stenosis. 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 --No retroperitoneal lymphadenopathy. --No mesenteric lymphadenopathy. --No pelvic or inguinal lymphadenopathy. Reproductive: Unremarkable Other: No ascites or free air. The abdominal wall is normal. Musculoskeletal. There is no acute displaced fracture. 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 IMPRESSION: 1. Liquid stool throughout the colon with mild circumferential wall thickening of the cecum, ascending colon, and hepatic flexure, suggestive of infectious or inflammatory colitis. 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 Aortic Atherosclerosis (ICD10-I70.0). Electronically Signed   By: Katherine Mantle M.D.   On: 02/04/2020 16:59   CT Angio Abd/Pel w/ and/or  w/o  Result Date: 02/06/2020 CLINICAL DATA:  Weakness, diarrhea, GI bleed, dual antiplatelet therapy for coronary disease EXAM: CT ANGIOGRAPHY ABDOMEN AND PELVIS WITH CONTRAST AND WITHOUT CONTRAST TECHNIQUE: Multidetector CT imaging of the abdomen and pelvis was performed using the standard protocol during bolus administration of intravenous contrast. Multiplanar reconstructed images and MIPs were obtained and reviewed to evaluate the vascular anatomy. CONTRAST:  OMNIPAQUE IOHEXOL 350 MG/ML SOLN COMPARISON:  02/04/2020 FINDINGS: VASCULAR Aorta: Atherosclerotic changes of the aorta without acute vascular process, dissection, aneurysm, or occlusive process. No evidence of rupture or leakage. No retroperitoneal hematoma. Celiac: Minor atherosclerotic changes but remains patent including its branches. No active upper GI bleeding in the celiac territory. SMA: Minor atherosclerotic changes but remains patent including its branches. No active GI arterial bleeding in the SMA territory. Renals: Atherosclerotic origins but remain patent. No accessory renal artery. IMA: Remains patent  off the distal aorta including its branches. No active GI arterial bleeding in the IMA territory. Inflow: Moderate iliac atherosclerosis but no iliac occlusion or significant inflow disease. The common, internal and external iliac arteries remain patent. Proximal Outflow: Common femoral arteries are atherosclerotic but remain patent. Profunda femoral arteries are patent bilaterally. Proximal bilateral SFA occlusions noted. Veins: No veno-occlusive process. Review of the MIP images confirms the above findings. NON-VASCULAR Lower chest: Inferior lingula scarring anteriorly. Otherwise clear lung bases. Normal heart size. No pericardial or pleural effusion. Hepatobiliary: Focal fatty infiltration of the liver along the falciform ligament anteriorly. No other focal hepatic abnormality. Hepatic and portal veins are patent. Remote  cholecystectomy. Common bile duct nondilated. Pancreas: Unremarkable. No pancreatic ductal dilatation or surrounding inflammatory changes. Spleen: Normal in size without focal abnormality. Adrenals/Urinary Tract: Adrenal glands are unremarkable. Kidneys are normal, without renal calculi, focal lesion, or hydronephrosis. Bladder is unremarkable. Stomach/Bowel: Negative for bowel obstruction, significant dilatation, ileus, or free air. Some improvement in the wall thickening of the right colon, hepatic flexure and proximal transverse colon. Findings compatible with improving colitis. Scattered colonic air-fluid levels compatible with ongoing diarrhea. No free fluid, fluid collection, hemorrhage, hematoma, abscess, or ascites. Lymphatic: No bulky adenopathy. Reproductive: No significant finding by CT. Other: No abdominal wall hernia or abnormality. No abdominopelvic ascites. Musculoskeletal: Degenerative changes noted spine. No acute osseous finding. 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 IMPRESSION: VASCULAR Negative for acute arterial GI bleeding by CTA. Aortoiliac atherosclerosis without occlusive disease. Bilateral proximal SFA occlusions appearing chronic. NON-VASCULAR Improving colonic wall thickening of the right colon, hepatic flexure and transverse colon compatible with resolving colitis. Aortic Atherosclerosis (ICD10-I70.0). Electronically Signed   By: Judie Petit.  Shick M.D.   On: 02/06/2020 10:46        Scheduled Meds: . carvedilol  6.25 mg Oral BID WC  . insulin aspart  0-9 Units Subcutaneous Q4H  . pantoprazole (PROTONIX) IV  40 mg Intravenous Q12H   Continuous Infusions: . ciprofloxacin 400 mg (02/06/20 0517)  . lactated ringers with kcl       LOS: 1 day    Time spent: 25 minutes    Tresa Moore, MD [facility] Pager [phone]  If 7PM-7AM, please contact night-coverage 02/06/2020, 10:58 AM

## 2020-02-07 LAB — BASIC METABOLIC PANEL
Anion gap: 6 (ref 5–15)
BUN: 9 mg/dL (ref 6–20)
CO2: 24 mmol/L (ref 22–32)
Calcium: 8 mg/dL — ABNORMAL LOW (ref 8.9–10.3)
Chloride: 107 mmol/L (ref 98–111)
Creatinine, Ser: 1.02 mg/dL (ref 0.61–1.24)
GFR calc Af Amer: >60 mL/min (ref >60)
GFR calc non Af Amer: >60 mL/min (ref >60)
Glucose, Bld: 123 mg/dL — ABNORMAL HIGH (ref 70–99)
Potassium: 3.8 mmol/L (ref 3.5–5.1)
Sodium: 137 mmol/L (ref 135–145)

## 2020-02-07 LAB — GLUCOSE, CAPILLARY
Glucose-Capillary: 110 mg/dL — ABNORMAL HIGH (ref 70–99)
Glucose-Capillary: 126 mg/dL — ABNORMAL HIGH (ref 70–99)
Glucose-Capillary: 145 mg/dL — ABNORMAL HIGH (ref 70–99)
Glucose-Capillary: 158 mg/dL — ABNORMAL HIGH (ref 70–99)

## 2020-02-07 MED ORDER — ASPIRIN EC 81 MG PO TBEC
81.0000 mg | DELAYED_RELEASE_TABLET | Freq: Every day | ORAL | Status: DC
  Administered 2020-02-07: 81 mg via ORAL
  Filled 2020-02-07: qty 1

## 2020-02-07 MED ORDER — ONDANSETRON HCL 4 MG PO TABS: 4.0000 mg | ORAL_TABLET | Freq: Four times a day (QID) | ORAL | 0 refills | Status: AC | PRN

## 2020-02-07 MED ORDER — CIPROFLOXACIN HCL 500 MG PO TABS: 500.0000 mg | ORAL_TABLET | Freq: Two times a day (BID) | ORAL | 0 refills | Status: AC

## 2020-02-07 MED ORDER — TICAGRELOR 90 MG PO TABS
90.0000 mg | ORAL_TABLET | Freq: Two times a day (BID) | ORAL | Status: DC
  Administered 2020-02-07: 90 mg via ORAL
  Filled 2020-02-07 (×2): qty 1

## 2020-02-07 NOTE — Discharge Summary (Signed)
Physician Discharge Summary  Alexander Benson UEA:[id number] DOB: 1960-12-16 DOA: 02/04/2020  PCP: System, Provider Not In  Admit date: 02/04/2020 Discharge date: 02/07/2020  Admitted From: Home Disposition:  Home  Recommendations for Outpatient Follow-up:  1. Follow up with PCP in 1-2 weeks 2.   Home Health:No Equipment/Devices:None Discharge Condition:Stable CODE STATUS:Full Diet recommendation: Heart Healthy / Carb Modified  Brief/Interim Summary: 59 year old male history of coronary artery disease status post NSTEMI with DES in January 2021, type 2 diabetes mellitus, hypertension, hyperlipidemia, GERD history of tobacco use who presents for abdominal pain and diarrhea  Patient endorses large meal consisting of chicken and dumplings 1 week prior to presentation and subsequent weakness and worsening diarrhea.  Patient is on dual antiplatelet therapy for coronary artery disease status post DES.  GI PCR panel positive for Salmonella  Patient has had waxing and waning symptoms.  He had an acute drop in his hemoglobin after several black tarry stools.  He was transfused 1 unit packed red blood cells on 02/05/2020 with appropriate rise in his hemoglobin.  Remains hemodynamically stable.  Patient GI bleed spontaneously stopped.  Hemoglobin stable.  Tolerating p.o. intake without issue.  Etiology felt all to be due to Salmonella enteritis.  Patient stable for discharge at this time.  As needed antiemetics and antibiotics prescribed on discharge.  Patient has a long drive to [place].  Will fill his prescriptions at a local pharmacy prior to him going.  Discharge Diagnoses:  Principal Problem:   Colitis Active Problems:   Coronary artery disease   Diabetes mellitus without complication (HCC)   Hypertension associated with diabetes (HCC)   Hyperlipidemia associated with type 2 diabetes mellitus (HCC)   Salmonella enteritis  Salmonella enteritis Intractable nausea vomiting Abdominal  pain Likely secondary to poultry consumption 1 week prior to presentation Nausea vomiting and abdominal pain improved.  Patient also had some black tarry stools with drop in hemoglobin.  He did receive 1 unit packed red blood cells during admission.  Hemoglobin stable posttransfusion and black stools resolved.  Patient able to tolerate tolerate p.o. intake and had a bowel movement prior to discharge.  Stable for discharge at this time.  Continue ciprofloxacin 500 mg every 12 hours for total 7-day course.  As needed antiemetics prescribed as well.  Okay to resume dual antiplatelet therapy.  Melenic stools Symptomatic blood loss anemia Secondary to infectious process Patient had significant drop in his hemoglobin from high of 15 to a low of 9 Was transfused 1 unit on 02/05/2020 for active bleeding No active bleeding noted on discharge.  Twice daily Protonix not indicated.  Complete antibiotic course as above.  As needed antiemetics.  Resume dual antiplatelet therapy.  See cardiologist on upon return back to [place].  Coronary disease status post NSTEMI with DES in January 2021 Patient was on dual antiplatelet therapy with aspirin Brilinta Held for several days as he has been unable to tolerate p.o. Patient stent was placed 8 months ago High risk for stent rethrombosis with holding DAPT Per patient he has been adherent to his DAPT since January.  He only stopped taking from Monday approximately 3 days prior to presentation Hemoglobin stable at time of discharge.  Can resume DAPT.  Continue Coreg 6.25 mg twice daily.  Statin resumed.  Type 2 diabetes mellitus No medical management on home regimen Last A1c 7.3 on 01/10/2020 Sensitive sliding scale insulin while admitted  Hypertension Continue home Coreg  Hyperlipidemia Statin held.  Can continue on discharge  Tobacco use Educate  patient Nicotine patch offered, declined  Discharge Instructions  Discharge Instructions    Diet -  low sodium heart healthy   Complete by: As directed    Increase activity slowly   Complete by: As directed      Allergies as of 02/07/2020      Reactions   Metformin Rash   Penicillins Rash, Dermatitis   No known reaction No known reaction      Medication List    TAKE these medications   aspirin 81 MG EC tablet Take 81 mg by mouth daily.   atorvastatin 80 MG tablet Commonly known as: LIPITOR Take 80 mg by mouth at bedtime.   carvedilol 6.25 MG tablet Commonly known as: COREG Take 6.25 mg by mouth in the morning and at bedtime.   ciprofloxacin 500 MG tablet Commonly known as: Cipro Take 1 tablet (500 mg total) by mouth 2 (two) times daily for 5 days.   ondansetron 4 MG tablet Commonly known as: ZOFRAN Take 1 tablet (4 mg total) by mouth every 6 (six) hours as needed for nausea or vomiting.   ticagrelor 90 MG Tabs tablet Commonly known as: BRILINTA Take 90 mg by mouth in the morning and at bedtime.       Allergies  Allergen Reactions  . Metformin Rash  . Penicillins Rash and Dermatitis    No known reaction No known reaction     Consultations:  None   Procedures/Studies: DG Abd 1 View  Result Date: 02/05/2020 CLINICAL DATA:  Intractable nausea and vomiting. EXAM: ABDOMEN - 1 VIEW COMPARISON:  Abdomen pelvis CT, dated February 04, 2020. FINDINGS: There is no evidence of bowel dilatation. Thickening of the wall of the transverse colon is suspected due to an extensive amount of thumb printing within this region. Radiopaque surgical clips are seen overlying the right upper quadrant and lower left pelvis. 4 mm and 5 mm soft tissue calcifications are seen overlying the expected region of the mid left kidney. Upon evaluation of the prior abdomen pelvis CT, these appear to be vascular in origin. IMPRESSION: 1. Findings likely consistent with colitis involving the transverse colon. 2. Subcentimeter calcifications within the mid left abdomen which appear to be vascular  in location. 3. Prior cholecystectomy. Electronically Signed   By: Aram Candela M.D.   On: 02/05/2020 19:40   CT ABDOMEN PELVIS W CONTRAST  Result Date: 02/04/2020 CLINICAL DATA:  Abdominal pain.  Swelling and weakness. EXAM: CT ABDOMEN AND PELVIS WITH CONTRAST TECHNIQUE: Multidetector CT imaging of the abdomen and pelvis was performed using the standard protocol following bolus administration of intravenous contrast. CONTRAST:  OMNIPAQUE IOHEXOL 300 MG/ML  SOLN COMPARISON:  None. FINDINGS: Lower chest: The lung bases are clear. The heart size is normal. Hepatobiliary: The liver is normal. Status post cholecystectomy.There is no biliary ductal dilation. Pancreas: Normal contours without ductal dilatation. No peripancreatic fluid collection. Spleen: Unremarkable. Adrenals/Urinary Tract: --Adrenal glands: Unremarkable. --Right kidney/ureter: No hydronephrosis or radiopaque kidney stones. --Left kidney/ureter: No hydronephrosis or radiopaque kidney stones. --Urinary bladder: Unremarkable. Stomach/Bowel: --Stomach/Duodenum: No hiatal hernia or other gastric abnormality. Normal duodenal course and caliber. --Small bowel: Unremarkable. --Colon: Liquid stool is noted throughout the colon. There is some mild circumferential wall thickening of the cecum, ascending colon, and hepatic flexure. --Appendix: Normal. Vascular/Lymphatic: Atherosclerotic calcification is present within the non-aneurysmal abdominal aorta, without hemodynamically significant stenosis. 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 --No retroperitoneal lymphadenopathy. --No mesenteric lymphadenopathy. --No pelvic or inguinal lymphadenopathy. Reproductive:  Unremarkable Other: No ascites or free air. The abdominal wall is normal. Musculoskeletal. There is no acute displaced fracture. 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 IMPRESSION: 1. Liquid stool throughout the colon with mild circumferential  wall thickening of the cecum, ascending colon, and hepatic flexure, suggestive of infectious or inflammatory colitis. 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 Aortic Atherosclerosis (ICD10-I70.0). Electronically Signed   By: Katherine Mantle M.D.   On: 02/04/2020 16:59   CT Angio Abd/Pel w/ and/or w/o  Result Date: 02/06/2020 CLINICAL DATA:  Weakness, diarrhea, GI bleed, dual antiplatelet therapy for coronary disease EXAM: CT ANGIOGRAPHY ABDOMEN AND PELVIS WITH CONTRAST AND WITHOUT CONTRAST TECHNIQUE: Multidetector CT imaging of the abdomen and pelvis was performed using the standard protocol during bolus administration of intravenous contrast. Multiplanar reconstructed images and MIPs were obtained and reviewed to evaluate the vascular anatomy. CONTRAST:  OMNIPAQUE IOHEXOL 350 MG/ML SOLN COMPARISON:  02/04/2020 FINDINGS: VASCULAR Aorta: Atherosclerotic changes of the aorta without acute vascular process, dissection, aneurysm, or occlusive process. No evidence of rupture or leakage. No retroperitoneal hematoma. Celiac: Minor atherosclerotic changes but remains patent including its branches. No active upper GI bleeding in the celiac territory. SMA: Minor atherosclerotic changes but remains patent including its branches. No active GI arterial bleeding in the SMA territory. Renals: Atherosclerotic origins but remain patent. No accessory renal artery. IMA: Remains patent off the distal aorta including its branches. No active GI arterial bleeding in the IMA territory. Inflow: Moderate iliac atherosclerosis but no iliac occlusion or significant inflow disease. The common, internal and external iliac arteries remain patent. Proximal Outflow: Common femoral arteries are atherosclerotic but remain patent. Profunda femoral arteries are patent bilaterally. Proximal bilateral SFA occlusions noted. Veins: No veno-occlusive process. Review of the MIP  images confirms the above findings. NON-VASCULAR Lower chest: Inferior lingula scarring anteriorly. Otherwise clear lung bases. Normal heart size. No pericardial or pleural effusion. Hepatobiliary: Focal fatty infiltration of the liver along the falciform ligament anteriorly. No other focal hepatic abnormality. Hepatic and portal veins are patent. Remote cholecystectomy. Common bile duct nondilated. Pancreas: Unremarkable. No pancreatic ductal dilatation or surrounding inflammatory changes. Spleen: Normal in size without focal abnormality. Adrenals/Urinary Tract: Adrenal glands are unremarkable. Kidneys are normal, without renal calculi, focal lesion, or hydronephrosis. Bladder is unremarkable. Stomach/Bowel: Negative for bowel obstruction, significant dilatation, ileus, or free air. Some improvement in the wall thickening of the right colon, hepatic flexure and proximal transverse colon. Findings compatible with improving colitis. Scattered colonic air-fluid levels compatible with ongoing diarrhea. No free fluid, fluid collection, hemorrhage, hematoma, abscess, or ascites. Lymphatic: No bulky adenopathy. Reproductive: No significant finding by CT. Other: No abdominal wall hernia or abnormality. No abdominopelvic ascites. Musculoskeletal: Degenerative changes noted spine. No acute osseous finding. 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 IMPRESSION: VASCULAR Negative for acute arterial GI bleeding by CTA. Aortoiliac atherosclerosis without occlusive disease. Bilateral proximal SFA occlusions appearing chronic. NON-VASCULAR Improving colonic wall thickening of the right colon, hepatic flexure and transverse colon compatible with resolving colitis. Aortic Atherosclerosis (ICD10-I70.0). Electronically Signed   By: Judie Petit.  Shick M.D.   On: 02/06/2020 10:46    (Echo, Carotid, EGD, Colonoscopy, ERCP)    Subjective: Seen and examined the day of discharge.  Stable no distress.  In good spirits.   Tolerating p.o. intake.  Stable for discharge home.  Discharge Exam: Vitals:   02/06/20 2006 02/07/20 0522  BP: 105/81 99/61  Pulse:  71 69  Resp: 18 16  Temp: 98.4 F (36.9 C) (!) 97.4 F (36.3 C)  SpO2: 99% 100%   Vitals:   02/06/20 0803 02/06/20 1237 02/06/20 2006 02/07/20 0522  BP: 137/72 105/69 105/81 99/61  Pulse: 67 68 71 69  Resp: Temp:  98.3 F (36.8 C) 98.4 F (36.9 C) (!) 97.4 F (36.3 C)  TempSrc:  Oral Oral Oral  SpO2: 98% 99% 99% 100%  Weight:      Height:        General: Pt is alert, awake, not in acute distress Cardiovascular: RRR, S1/S2 +, no rubs, no gallops Respiratory: CTA bilaterally, no wheezing, no rhonchi Abdominal: Soft, NT, ND, bowel sounds + Extremities: no edema, no cyanosis    The results of significant diagnostics from this hospitalization (including imaging, microbiology, ancillary and laboratory) are listed below for reference.     Microbiology: Recent Results (from the past 240 hour(s))  Gastrointestinal Panel by PCR , Stool     Status: Abnormal   Collection Time: 02/04/20  3:57 PM   Specimen: Stool  Result Value Ref Range Status   Campylobacter species NOT DETECTED NOT DETECTED Final   Plesimonas shigelloides NOT DETECTED NOT DETECTED Final   Salmonella species DETECTED (A) NOT DETECTED Final    Comment: RESULT CALLED TO, READ BACK BY AND VERIFIED WITH: DEMARCUS STEWARD AT 2306 ON 02/04/20 RWW    Yersinia enterocolitica NOT DETECTED NOT DETECTED Final   Vibrio species NOT DETECTED NOT DETECTED Final   Vibrio cholerae NOT DETECTED NOT DETECTED Final   Enteroaggregative E coli (EAEC) NOT DETECTED NOT DETECTED Final   Enteropathogenic E coli (EPEC) NOT DETECTED NOT DETECTED Final   Enterotoxigenic E coli (ETEC) NOT DETECTED NOT DETECTED Final   Shiga like toxin producing E coli (STEC) NOT DETECTED NOT DETECTED Final   Shigella/Enteroinvasive E coli (EIEC) NOT DETECTED NOT DETECTED Final   Cryptosporidium NOT DETECTED  NOT DETECTED Final   Cyclospora cayetanensis NOT DETECTED NOT DETECTED Final   Entamoeba histolytica NOT DETECTED NOT DETECTED Final   Giardia lamblia NOT DETECTED NOT DETECTED Final   Adenovirus F40/41 NOT DETECTED NOT DETECTED Final   Astrovirus NOT DETECTED NOT DETECTED Final   Norovirus GI/GII NOT DETECTED NOT DETECTED Final   Rotavirus A NOT DETECTED NOT DETECTED Final   Sapovirus (I, II, IV, and V) NOT DETECTED NOT DETECTED Final    Comment: Performed at [facility], [address] [place], [place] [address]  C Difficile Quick Screen w PCR reflex     Status: None   Collection Time: 02/04/20  3:57 PM   Specimen: STOOL  Result Value Ref Range Status   C Diff antigen NEGATIVE NEGATIVE Final   C Diff toxin NEGATIVE NEGATIVE Final   C Diff interpretation No C. difficile detected.  Final    Comment: Performed at [facility], [address] [place], [place] [address]  SARS Coronavirus 2 by RT PCR (hospital order, performed in [facility] hospital lab) Nasopharyngeal Nasopharyngeal Swab     Status: None   Collection Time: 02/04/20  6:09 PM   Specimen: Nasopharyngeal Swab  Result Value Ref Range Status   SARS Coronavirus 2 NEGATIVE NEGATIVE Final    Comment: (NOTE) SARS-CoV-2 target nucleic acids are NOT DETECTED.  The SARS-CoV-2 RNA is generally detectable in upper and lower respiratory specimens during the acute phase of infection. The lowest concentration of SARS-CoV-2 viral copies this assay can detect is 250 copies /  mL. A negative result does not preclude SARS-CoV-2 infection and should not be used as the sole basis for treatment or other patient management decisions.  A negative result may occur with improper specimen collection / handling, submission of specimen other than nasopharyngeal swab, presence of viral mutation(s) within the areas targeted by this assay, and inadequate number of viral copies (<250 copies / mL). A negative result must be  combined with clinical observations, patient history, and epidemiological information.  Fact Sheet for Patients:   BoilerBrush.com.cyhttps://www.fda.gov/media/136312/download  Fact Sheet for Healthcare Providers: https://pope.com/https://www.fda.gov/media/136313/download  This test is not yet approved or  cleared by the [place] FDA and has been authorized for detection and/or diagnosis of SARS-CoV-2 by FDA under an Emergency Use Authorization (EUA).  This EUA will remain in effect (meaning this test can be used) for the duration of the COVID-19 declaration under Section 564(b)(1) of the Act, 21 U.S.C. section 360bbb-3(b)(1), unless the authorization is terminated or revoked sooner.  Performed at [facility], [address] [place], [place] [address]      Labs: BNP (last 3 results) No results for input(s): BNP in the last 8760 hours. Basic Metabolic Panel: Recent Labs  Lab 02/04/20 1015 02/05/20 0647 02/07/20 0406  NA 132* 136 137  K 3.5 3.6 3.8  CL 102 105 107  CO2 17* 24 24  GLUCOSE 158* 136* 123*  BUN 26* 21* 9  CREATININE 1.15 1.04 1.02  CALCIUM 9.0 8.1* 8.0*  MG  --  2.0  --    Liver Function Tests: Recent Labs  Lab 02/04/20 1015  AST 17  ALT 19  ALKPHOS 79  BILITOT 0.9  PROT 8.2*  ALBUMIN 4.2   Recent Labs  Lab 02/04/20 1626  LIPASE 23   No results for input(s): AMMONIA in the last 168 hours. CBC: Recent Labs  Lab 02/04/20 1015 02/04/20 1015 02/05/20 0647 02/05/20 2124 02/06/20 0352 02/06/20 0912 02/06/20 1455  WBC 15.8*  --  9.8  --   --   --   --   HGB 15.7   < > 12.7* 9.6* 10.7* 10.2* 10.2*  HCT 45.4   < > 37.2* 28.4* 30.1* 30.1* 28.8*  MCV 86.3  --  87.5  --   --   --   --   PLT 257  --  230  --   --   --   --    < > = values in this interval not displayed.   Cardiac Enzymes: No results for input(s): CKTOTAL, CKMB, CKMBINDEX, TROPONINI in the last 168 hours. BNP: Invalid input(s): POCBNP CBG: Recent Labs  Lab 02/06/20 2003 02/07/20 0016  02/07/20 0523 02/07/20 0739 02/07/20 1153  GLUCAP 72 110* 158* 126* 145*   D-Dimer No results for input(s): DDIMER in the last 72 hours. Hgb A1c No results for input(s): HGBA1C in the last 72 hours. Lipid Profile No results for input(s): CHOL, HDL, LDLCALC, TRIG, CHOLHDL, LDLDIRECT in the last 72 hours. Thyroid function studies No results for input(s): TSH, T4TOTAL, T3FREE, THYROIDAB in the last 72 hours.  Invalid input(s): FREET3 Anemia work up No results for input(s): VITAMINB12, FOLATE, FERRITIN, TIBC, IRON, RETICCTPCT in the last 72 hours. Urinalysis No results found for: COLORURINE, APPEARANCEUR, LABSPEC, PHURINE, GLUCOSEU, HGBUR, BILIRUBINUR, KETONESUR, PROTEINUR, UROBILINOGEN, NITRITE, LEUKOCYTESUR Sepsis Labs Invalid input(s): PROCALCITONIN,  WBC,  LACTICIDVEN Microbiology Recent Results (from the past 240 hour(s))  Gastrointestinal Panel by PCR , Stool     Status: Abnormal   Collection Time: 02/04/20  3:57 PM   Specimen: Stool  Result Value Ref Range Status   Campylobacter species NOT DETECTED NOT DETECTED Final   Plesimonas shigelloides NOT DETECTED NOT DETECTED Final   Salmonella species DETECTED (A) NOT DETECTED Final    Comment: RESULT CALLED TO, READ BACK BY AND VERIFIED WITH: DEMARCUS STEWARD AT 2306 ON 02/04/20 RWW    Yersinia enterocolitica NOT DETECTED NOT DETECTED Final   Vibrio species NOT DETECTED NOT DETECTED Final   Vibrio cholerae NOT DETECTED NOT DETECTED Final   Enteroaggregative E coli (EAEC) NOT DETECTED NOT DETECTED Final   Enteropathogenic E coli (EPEC) NOT DETECTED NOT DETECTED Final   Enterotoxigenic E coli (ETEC) NOT DETECTED NOT DETECTED Final   Shiga like toxin producing E coli (STEC) NOT DETECTED NOT DETECTED Final   Shigella/Enteroinvasive E coli (EIEC) NOT DETECTED NOT DETECTED Final   Cryptosporidium NOT DETECTED NOT DETECTED Final   Cyclospora cayetanensis NOT DETECTED NOT DETECTED Final   Entamoeba histolytica NOT DETECTED NOT  DETECTED Final   Giardia lamblia NOT DETECTED NOT DETECTED Final   Adenovirus F40/41 NOT DETECTED NOT DETECTED Final   Astrovirus NOT DETECTED NOT DETECTED Final   Norovirus GI/GII NOT DETECTED NOT DETECTED Final   Rotavirus A NOT DETECTED NOT DETECTED Final   Sapovirus (I, II, IV, and V) NOT DETECTED NOT DETECTED Final    Comment: Performed at [facility], [address] [place], [place] [address]  C Difficile Quick Screen w PCR reflex     Status: None   Collection Time: 02/04/20  3:57 PM   Specimen: STOOL  Result Value Ref Range Status   C Diff antigen NEGATIVE NEGATIVE Final   C Diff toxin NEGATIVE NEGATIVE Final   C Diff interpretation No C. difficile detected.  Final    Comment: Performed at [facility], [address] [place], [place] [address]  SARS Coronavirus 2 by RT PCR (hospital order, performed in [facility] hospital lab) Nasopharyngeal Nasopharyngeal Swab     Status: None   Collection Time: 02/04/20  6:09 PM   Specimen: Nasopharyngeal Swab  Result Value Ref Range Status   SARS Coronavirus 2 NEGATIVE NEGATIVE Final    Comment: (NOTE) SARS-CoV-2 target nucleic acids are NOT DETECTED.  The SARS-CoV-2 RNA is generally detectable in upper and lower respiratory specimens during the acute phase of infection. The lowest concentration of SARS-CoV-2 viral copies this assay can detect is 250 copies / mL. A negative result does not preclude SARS-CoV-2 infection and should not be used as the sole basis for treatment or other patient management decisions.  A negative result may occur with improper specimen collection / handling, submission of specimen other than nasopharyngeal swab, presence of viral mutation(s) within the areas targeted by this assay, and inadequate number of viral copies (<250 copies / mL). A negative result must be combined with clinical observations, patient history, and epidemiological information.  Fact Sheet for Patients:    BoilerBrush.com.cy  Fact Sheet for Healthcare Providers: https://pope.com/  This test is not yet approved or  cleared by the [place] FDA and has been authorized for detection and/or diagnosis of SARS-CoV-2 by FDA under an Emergency Use Authorization (EUA).  This EUA will remain in effect (meaning this test can be used) for the duration of the COVID-19 declaration under Section 564(b)(1) of the Act, 21 U.S.C. section 360bbb-3(b)(1), unless the authorization is terminated or revoked sooner.  Performed at [facility], [address] [place], [place] [address]  Time coordinating discharge: Over 30 minutes  SIGNED:   Tresa Moore, MD  [facility] 02/07/2020, 12:53 PM Pager   If 7PM-7AM, please contact night-coverage

## 2020-02-07 NOTE — Progress Notes (Signed)
Alexander Benson  A and O x 4 VSS. Pt tolerating diet well. No complaints of pain or nausea. IV removed intact, prescriptions given. Pt voices understanding of discharge instructions with no further questions. Pt discharged via wheelchair with axillary.   Allergies as of 02/07/2020      Reactions   Metformin Rash   Penicillins Rash, Dermatitis   No known reaction No known reaction      Medication List    TAKE these medications   aspirin 81 MG EC tablet Take 81 mg by mouth daily.   atorvastatin 80 MG tablet Commonly known as: LIPITOR Take 80 mg by mouth at bedtime.   carvedilol 6.25 MG tablet Commonly known as: COREG Take 6.25 mg by mouth in the morning and at bedtime.   ciprofloxacin 500 MG tablet Commonly known as: Cipro Take 1 tablet (500 mg total) by mouth 2 (two) times daily for 5 days.   ondansetron 4 MG tablet Commonly known as: ZOFRAN Take 1 tablet (4 mg total) by mouth every 6 (six) hours as needed for nausea or vomiting.   ticagrelor 90 MG Tabs tablet Commonly known as: BRILINTA Take 90 mg by mouth in the morning and at bedtime.       Vitals:   02/06/20 2006 02/07/20 0522  BP: 105/81 99/61  Pulse: 71 69  Resp: 18 16  Temp: 98.4 F (36.9 C) (!) 97.4 F (36.3 C)  SpO2: 99% 100%    Whitney Post

## 2022-03-14 IMAGING — CT CT ABD-PELV W/ CM
2 of 5 series · 16 of 46 positions shown, 18 images · IV contrast (APPLIED)
Comparison: None.

CLINICAL DATA: Abdominal pain.  Swelling and weakness.

EXAM:
CT ABDOMEN AND PELVIS WITH CONTRAST
TECHNIQUE: Multidetector CT imaging of the abdomen and pelvis was performed
using the standard protocol following bolus administration of
intravenous contrast.
CONTRAST:  100mL OMNIPAQUE IOHEXOL 300 MG/ML  SOLN

[Series 2: axial st · axial · 0.82mm/px · z∈[+276,+696]mm · 13 of 94 slices shown, 15 images]
[im 5/94  soft-tissue]
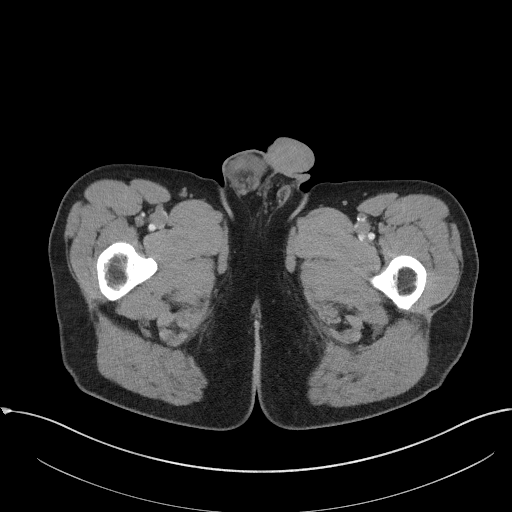
[im 5/94  bone]
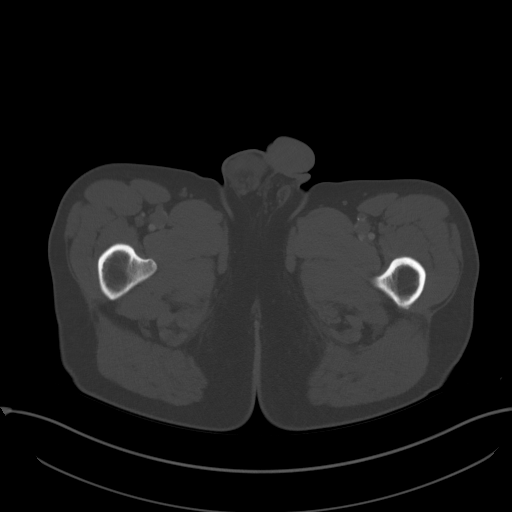
[im 14/94  soft-tissue]
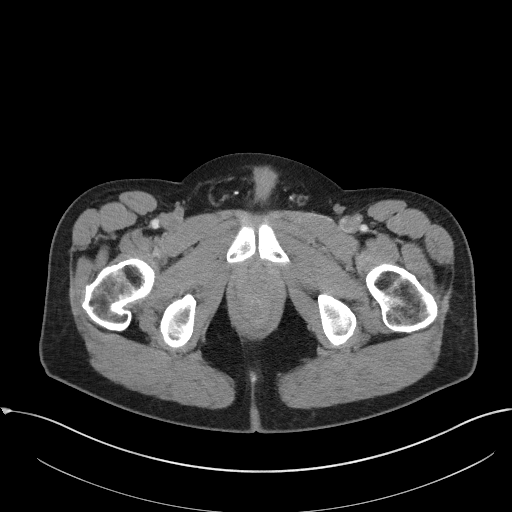
[im 19/94  soft-tissue]
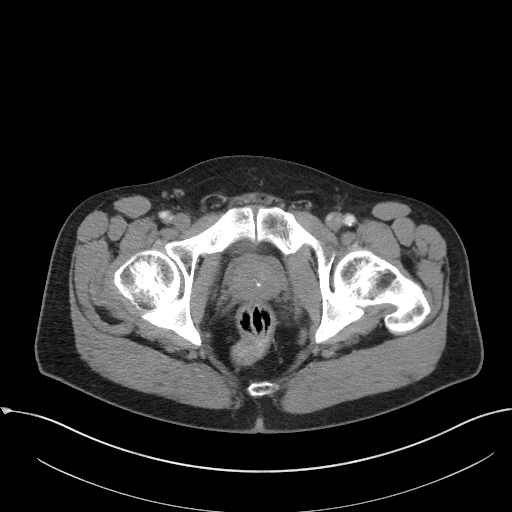
[im 28/94  soft-tissue]
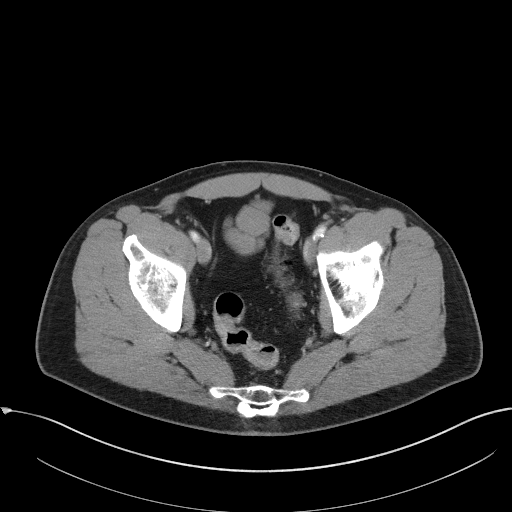
[im 33/94  soft-tissue]
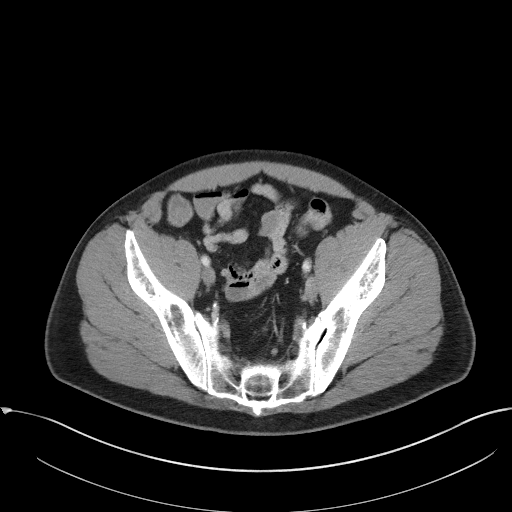
[im 42/94  soft-tissue]
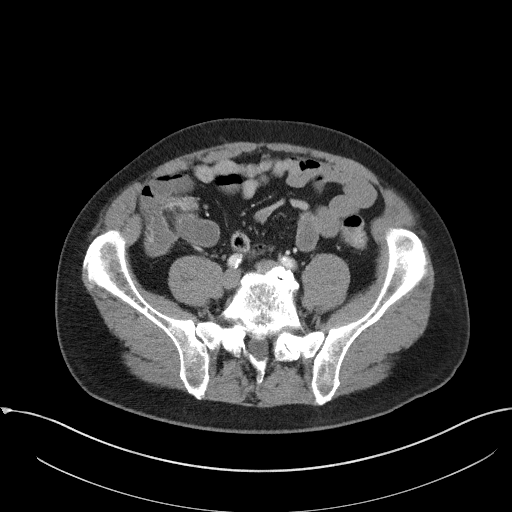
[im 47/94  soft-tissue]
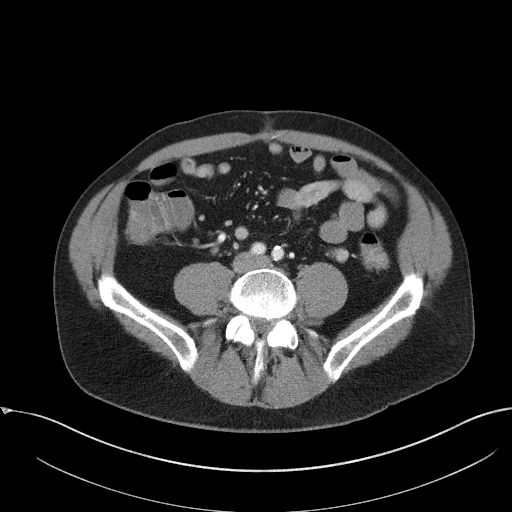
[im 52/94  soft-tissue]
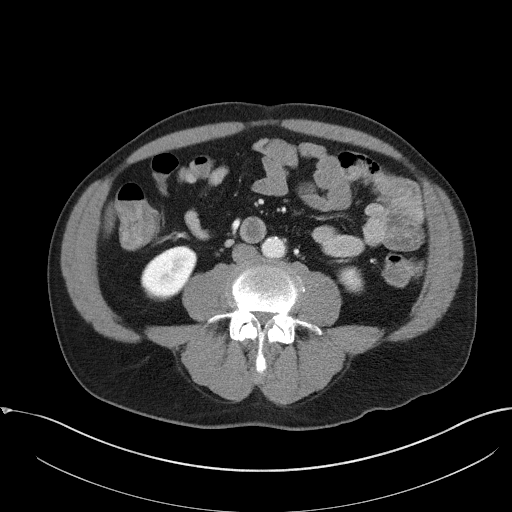
[im 61/94  soft-tissue]
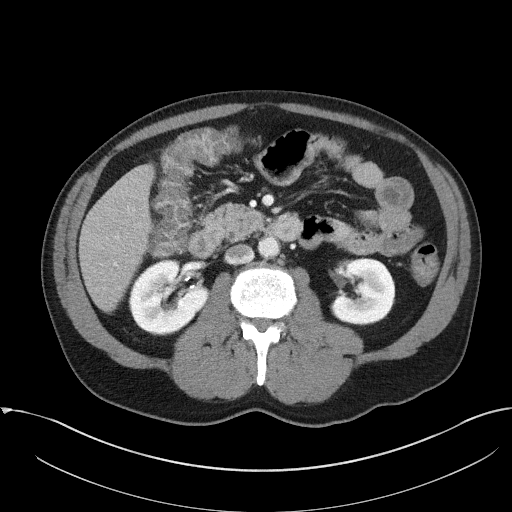
[im 61/94  bone]
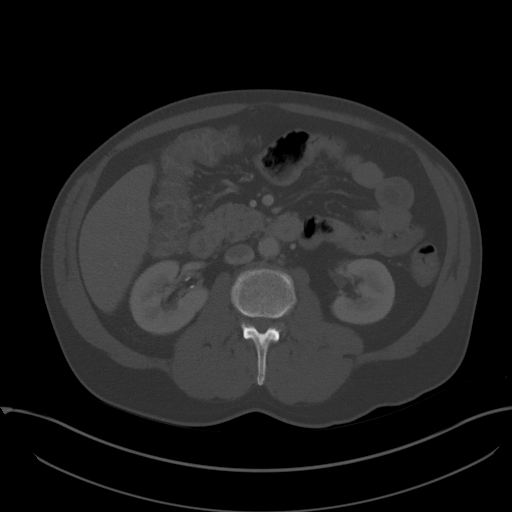
[im 66/94  soft-tissue]
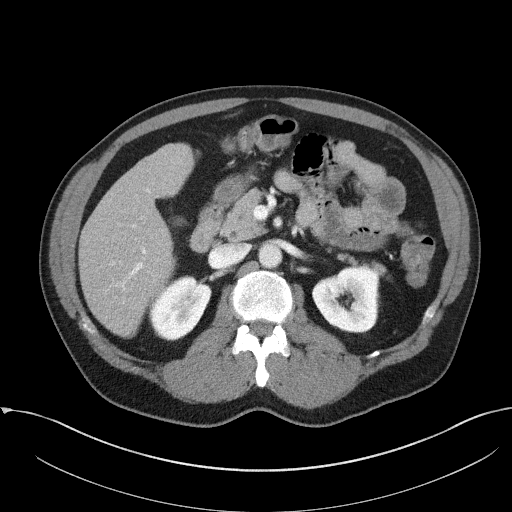
[im 75/94  soft-tissue]
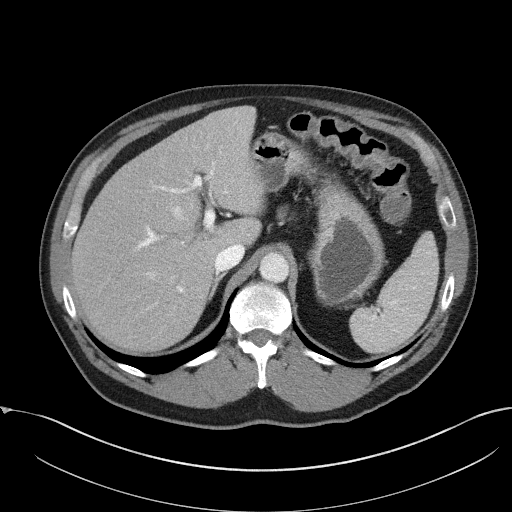
[im 80/94  soft-tissue]
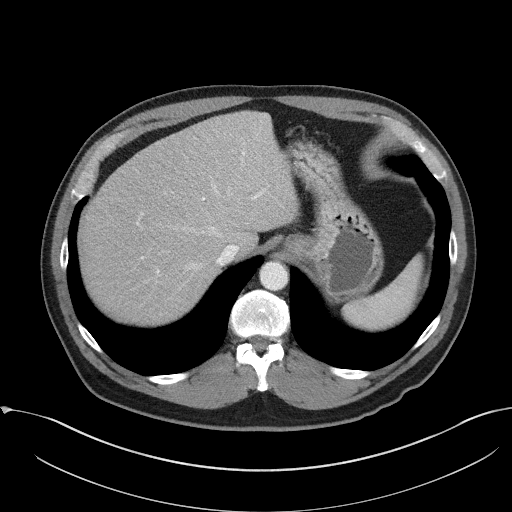
[im 89/94  soft-tissue]
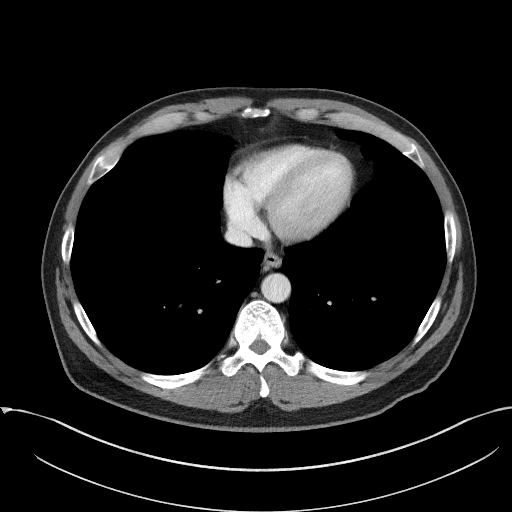

[Series 5: coronal st · coronal · 0.73mm/px · 3 of 90 slices shown]
[im 30/90  soft-tissue]
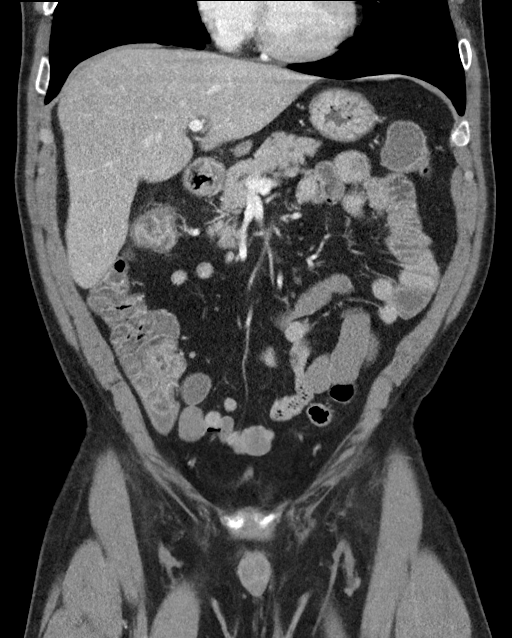
[im 40/90  soft-tissue]
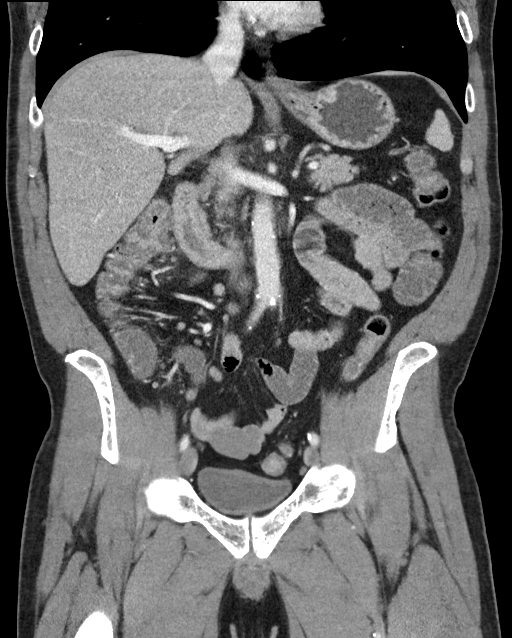
[im 50/90  soft-tissue]
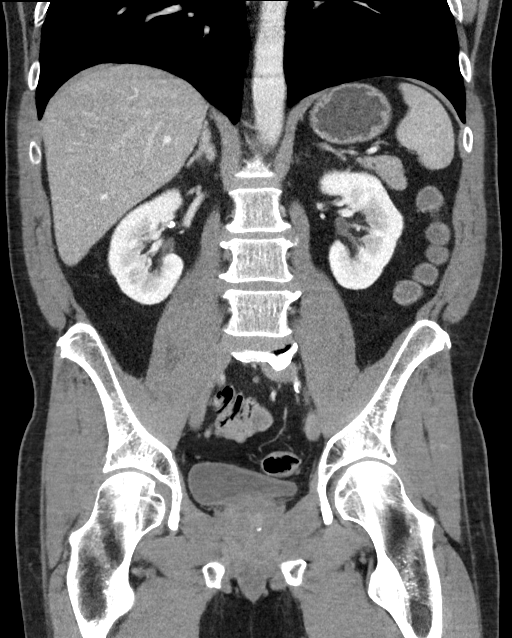

[16 of 46 positions shown; findings below may reference images not displayed]

FINDINGS: Lower chest: The lung bases are clear. The heart size is normal.

Hepatobiliary: The liver is normal. Status post
cholecystectomy.There is no biliary ductal dilation.

Pancreas: Normal contours without ductal dilatation. No
peripancreatic fluid collection.

Spleen: Unremarkable.

Adrenals/Urinary Tract:

--Adrenal glands: Unremarkable.

--Right kidney/ureter: No hydronephrosis or radiopaque kidney
stones.

--Left kidney/ureter: No hydronephrosis or radiopaque kidney stones.

--Urinary bladder: Unremarkable.

Stomach/Bowel:

--Stomach/Duodenum: No hiatal hernia or other gastric abnormality.
Normal duodenal course and caliber.

--Small bowel: Unremarkable.

--Colon: Liquid stool is noted throughout the colon. There is some
mild circumferential wall thickening of the cecum, ascending colon,
and hepatic flexure.

--Appendix: Normal.

Vascular/Lymphatic: Atherosclerotic calcification is present within
the non-aneurysmal abdominal aorta, without hemodynamically
significant stenosis. There appears to be chronic occlusion of the
bilateral proximal SFAs.

--No retroperitoneal lymphadenopathy.

--No mesenteric lymphadenopathy.

--No pelvic or inguinal lymphadenopathy.

Reproductive: Unremarkable

Other: No ascites or free air. The abdominal wall is normal.

Musculoskeletal. There is no acute displaced fracture. There is
avascular necrosis of the bilateral femoral heads.
IMPRESSION: 1. Liquid stool throughout the colon with mild circumferential wall
thickening of the cecum, ascending colon, and hepatic flexure,
suggestive of infectious or inflammatory colitis.
2. Chronic occlusion of the bilateral proximal SFAs.
3. Avascular necrosis of the bilateral femoral heads.

Aortic Atherosclerosis ([id number].D).

## 2022-03-16 IMAGING — CT CT CTA ABD/PEL W/CM AND/OR W/O CM
3 of 10 series · 11 of 46 positions shown, 17 images · IV contrast (omnipaque)
Comparison: 02/04/2020

CLINICAL DATA: Weakness, diarrhea, GI bleed, dual antiplatelet
therapy for coronary disease

EXAM:
CT ANGIOGRAPHY ABDOMEN AND PELVIS WITH CONTRAST AND WITHOUT CONTRAST
TECHNIQUE: Multidetector CT imaging of the abdomen and pelvis was performed
using the standard protocol during bolus administration of
intravenous contrast. Multiplanar reconstructed images and MIPs were
obtained and reviewed to evaluate the vascular anatomy.
CONTRAST:  100mL OMNIPAQUE IOHEXOL 350 MG/ML SOLN

[Series 5: axial arterial · axial · arterial · 0.75mm/px · z∈[-749,-547]mm · 5 of 218 slices shown]
[im 15/218  soft-tissue]
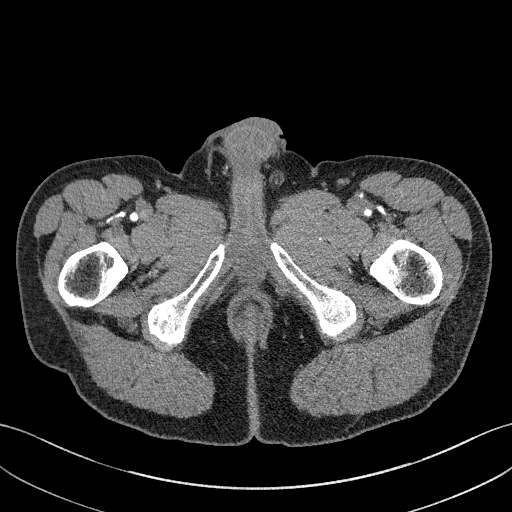
[im 44/218  soft-tissue]
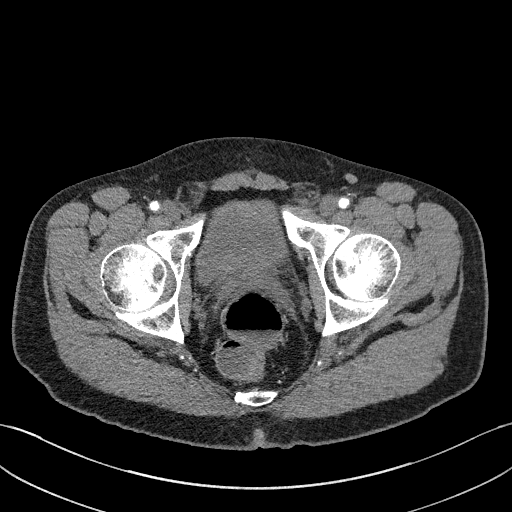
[im 73/218  soft-tissue]
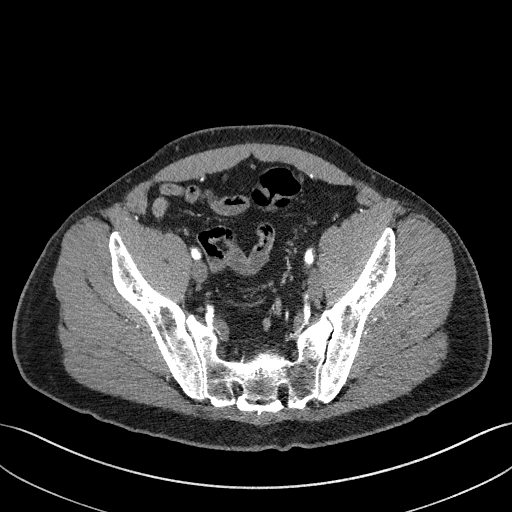
[im 102/218  soft-tissue]
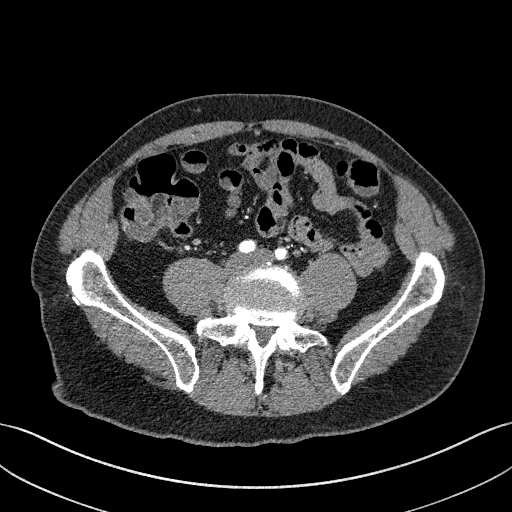
[im 116/218  soft-tissue]
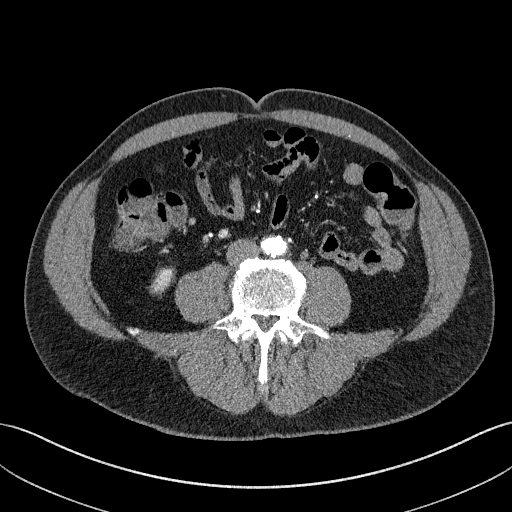

[Series 6: axial venous · axial · portal-venous · 0.75mm/px · z∈[-693,-433]mm · 4 of 88 slices shown, 9 images]
[im 18/88  soft-tissue]
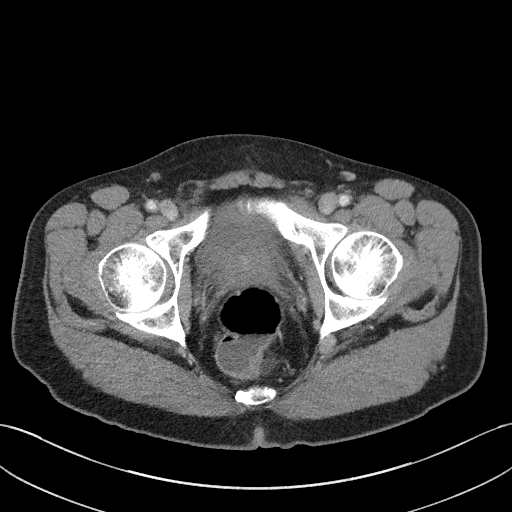
[im 18/88  lung]
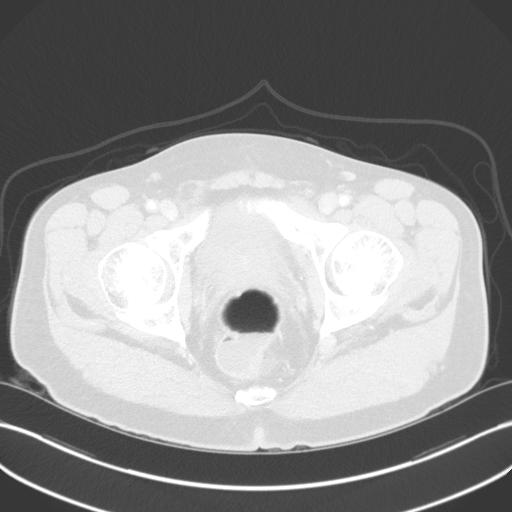
[im 18/88  bone]
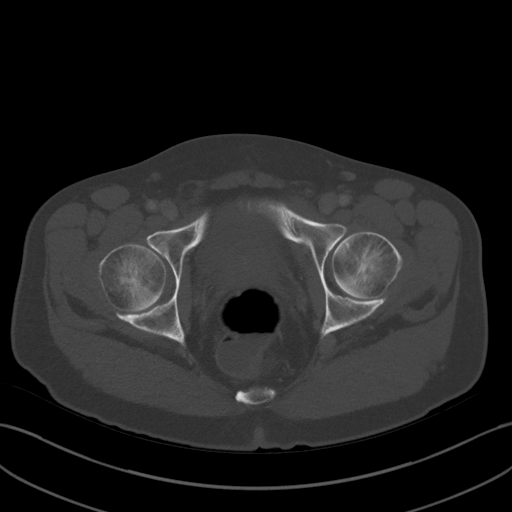
[im 35/88  soft-tissue]
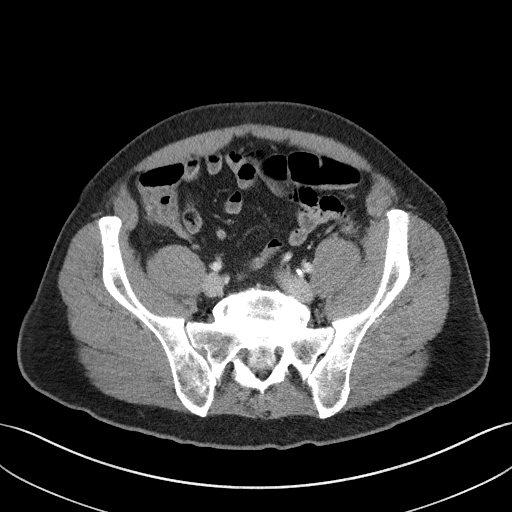
[im 35/88  lung]
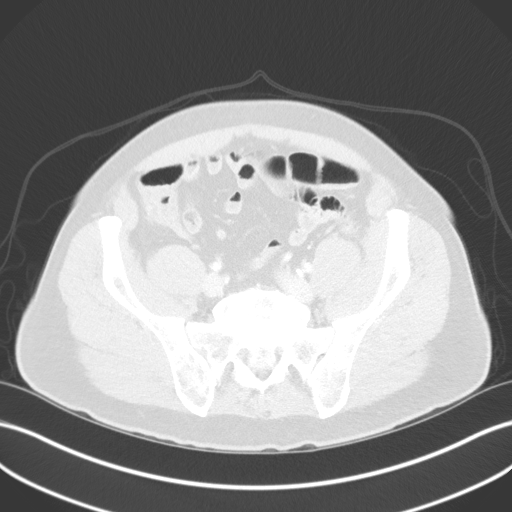
[im 53/88  soft-tissue]
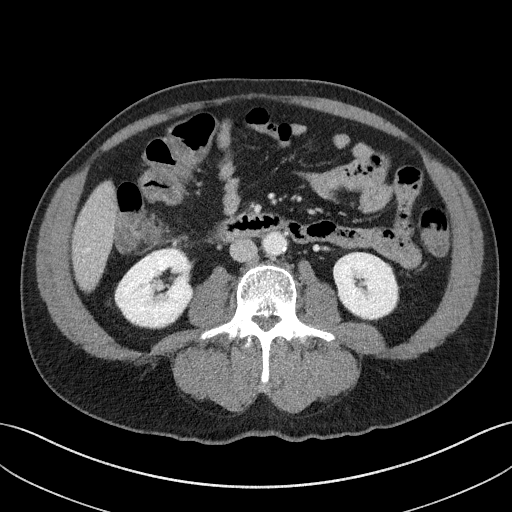
[im 53/88  lung]
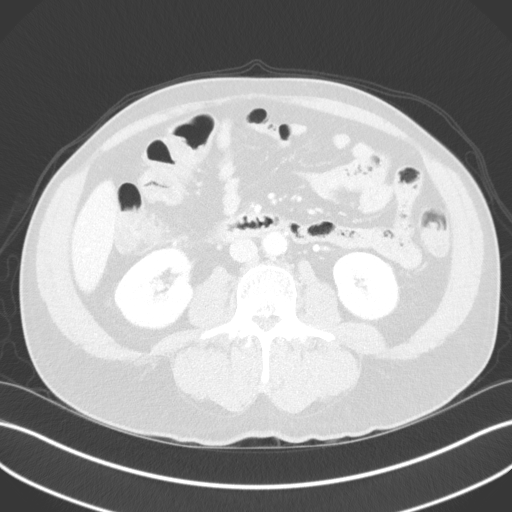
[im 70/88  soft-tissue]
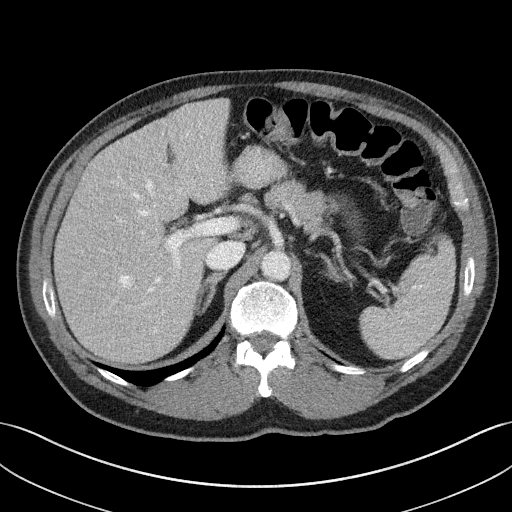
[im 70/88  lung]
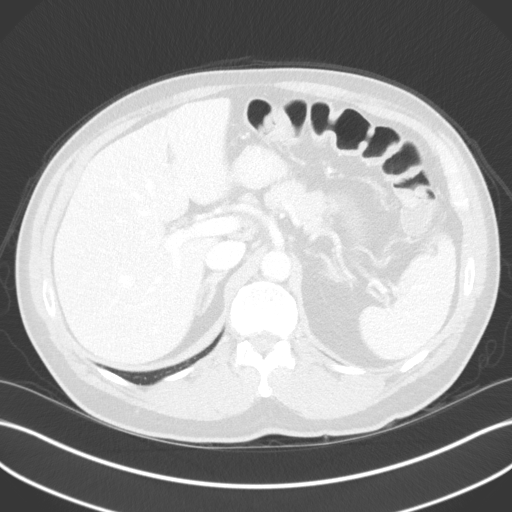

[Series 9: coronal mpr · coronal · 0.73mm/px · 2 of 146 slices shown, 3 images]
[im 49/146  soft-tissue]
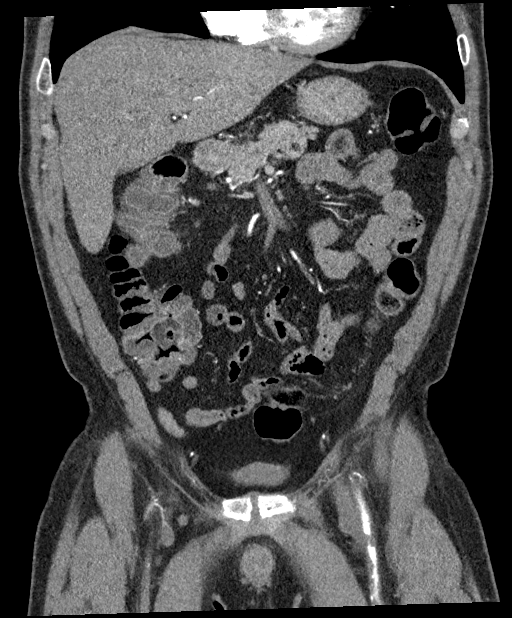
[im 49/146  bone]
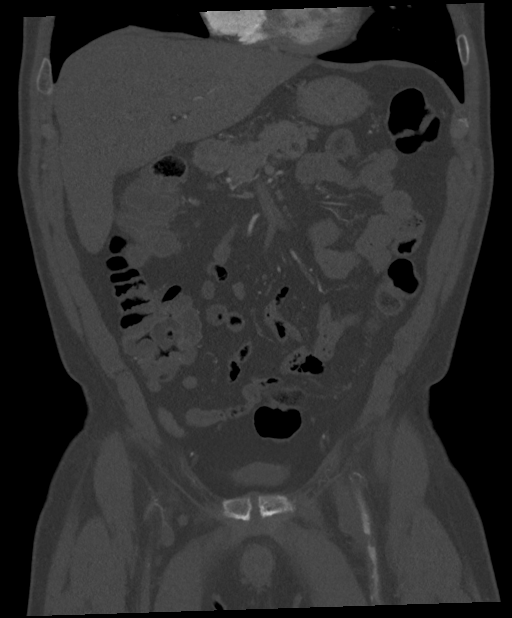
[im 97/146  soft-tissue]
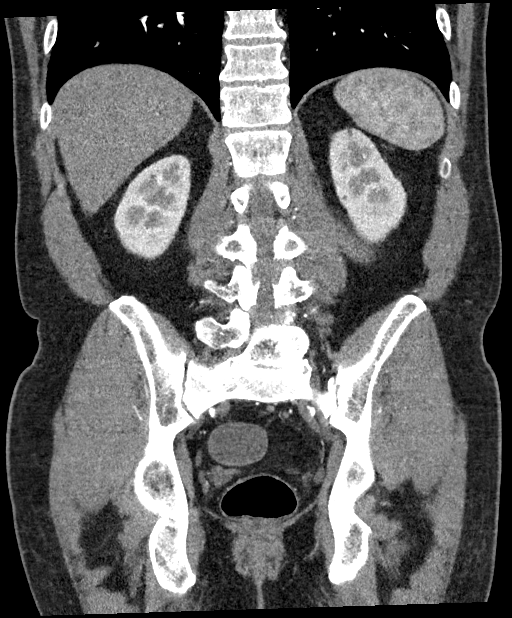

[11 of 46 positions shown; findings below may reference images not displayed]

FINDINGS: VASCULAR

Aorta: Atherosclerotic changes of the aorta without acute vascular
process, dissection, aneurysm, or occlusive process. No evidence of
rupture or leakage. No retroperitoneal hematoma.

Celiac: Minor atherosclerotic changes but remains patent including
its branches. No active upper GI bleeding in the celiac territory.

SMA: Minor atherosclerotic changes but remains patent including its
branches. No active GI arterial bleeding in the SMA territory.

Renals: Atherosclerotic origins but remain patent. No accessory
renal artery.

IMA: Remains patent off the distal aorta including its branches. No
active GI arterial bleeding in the IMA territory.

Inflow: Moderate iliac atherosclerosis but no iliac occlusion or
significant inflow disease. The common, internal and external iliac
arteries remain patent.

Proximal Outflow: Common femoral arteries are atherosclerotic but
remain patent. Profunda femoral arteries are patent bilaterally.
Proximal bilateral SFA occlusions noted.

Veins: No Gaith process.

Review of the MIP images confirms the above findings.

NON-VASCULAR

Lower chest: Inferior lingula scarring anteriorly. Otherwise clear
lung bases. Normal heart size. No pericardial or pleural effusion.

Hepatobiliary: Focal fatty infiltration of the liver along the
falciform ligament anteriorly. No other focal hepatic abnormality.
Hepatic and portal veins are patent. Remote cholecystectomy. Common
bile duct nondilated.

Pancreas: Unremarkable. No pancreatic ductal dilatation or
surrounding inflammatory changes.

Spleen: Normal in size without focal abnormality.

Adrenals/Urinary Tract: Adrenal glands are unremarkable. Kidneys are
normal, without renal calculi, focal lesion, or hydronephrosis.
Bladder is unremarkable.

Stomach/Bowel: Negative for bowel obstruction, significant
dilatation, ileus, or free air.

Some improvement in the wall thickening of the right colon, hepatic
flexure and proximal transverse colon. Findings compatible with
improving colitis. Scattered colonic air-fluid levels compatible
with ongoing diarrhea.

No free fluid, fluid collection, hemorrhage, hematoma, abscess, or
ascites.

Lymphatic: No bulky adenopathy.

Reproductive: No significant finding by CT.

Other: No abdominal wall hernia or abnormality. No abdominopelvic
ascites.

Musculoskeletal: Degenerative changes noted spine. No acute osseous
finding. Mild chronic AVN changes of the femoral heads bilaterally.
IMPRESSION: VASCULAR

Negative for acute arterial GI bleeding by CTA.

Aortoiliac atherosclerosis without occlusive disease.

Bilateral proximal SFA occlusions appearing chronic.

NON-VASCULAR

Improving colonic wall thickening of the right colon, hepatic
flexure and transverse colon compatible with resolving colitis.

Aortic Atherosclerosis ([id number].A).
